# Patient Record
Sex: Female | Born: 1965
Health system: Southern US, Community
[De-identification: ages and names within clinical notes are randomized; demographics above are authoritative.]

## PROBLEM LIST (undated history)

## (undated) DIAGNOSIS — E88819 Insulin resistance, unspecified: Secondary | ICD-10-CM

## (undated) DIAGNOSIS — E8881 Metabolic syndrome: Secondary | ICD-10-CM

## (undated) DIAGNOSIS — E282 Polycystic ovarian syndrome: Secondary | ICD-10-CM

## (undated) DIAGNOSIS — M722 Plantar fascial fibromatosis: Secondary | ICD-10-CM

## (undated) DIAGNOSIS — E669 Obesity, unspecified: Secondary | ICD-10-CM

## (undated) DIAGNOSIS — F329 Major depressive disorder, single episode, unspecified: Secondary | ICD-10-CM

## (undated) DIAGNOSIS — F32A Depression, unspecified: Secondary | ICD-10-CM

## (undated) DIAGNOSIS — F419 Anxiety disorder, unspecified: Secondary | ICD-10-CM

## (undated) DIAGNOSIS — E785 Hyperlipidemia, unspecified: Secondary | ICD-10-CM

## (undated) DIAGNOSIS — I10 Essential (primary) hypertension: Secondary | ICD-10-CM

## (undated) HISTORY — PX: CHOLECYSTECTOMY: SHX55

## (undated) HISTORY — DX: Depression, unspecified: F32.A

## (undated) HISTORY — PX: OTHER SURGICAL HISTORY: SHX169

## (undated) HISTORY — DX: Anxiety disorder, unspecified: F41.9

## (undated) HISTORY — DX: Plantar fascial fibromatosis: M72.2

## (undated) HISTORY — DX: Insulin resistance, unspecified: E88.819

## (undated) HISTORY — PX: POLYPECTOMY: SHX149

## (undated) HISTORY — PX: DILATION AND CURETTAGE OF UTERUS: SHX78

## (undated) HISTORY — PX: HYSTEROSCOPY: SHX211

## (undated) HISTORY — DX: Polycystic ovarian syndrome: E28.2

## (undated) HISTORY — DX: Hyperlipidemia, unspecified: E78.5

## (undated) HISTORY — DX: Obesity, unspecified: E66.9

## (undated) HISTORY — DX: Major depressive disorder, single episode, unspecified: F32.9

## (undated) HISTORY — DX: Essential (primary) hypertension: I10

## (undated) HISTORY — DX: Metabolic syndrome: E88.81

---

## 2001-12-05 ENCOUNTER — Encounter: Payer: Self-pay | Admitting: Family Medicine

## 2001-12-05 LAB — CONVERTED CEMR LAB: Pap Smear: NEGATIVE

## 2002-04-27 ENCOUNTER — Encounter (INDEPENDENT_AMBULATORY_CARE_PROVIDER_SITE_OTHER): Payer: Self-pay

## 2002-04-27 ENCOUNTER — Ambulatory Visit (HOSPITAL_COMMUNITY): Admission: RE | Admit: 2002-04-27 | Discharge: 2002-04-27 | Payer: Self-pay | Admitting: *Deleted

## 2003-04-28 ENCOUNTER — Other Ambulatory Visit: Payer: Self-pay

## 2003-12-11 ENCOUNTER — Other Ambulatory Visit: Admission: RE | Admit: 2003-12-11 | Discharge: 2003-12-11 | Payer: Self-pay | Admitting: Gynecology

## 2004-02-21 ENCOUNTER — Encounter (INDEPENDENT_AMBULATORY_CARE_PROVIDER_SITE_OTHER): Payer: Self-pay | Admitting: Specialist

## 2004-02-21 ENCOUNTER — Observation Stay (HOSPITAL_COMMUNITY): Admission: RE | Admit: 2004-02-21 | Discharge: 2004-02-22 | Payer: Self-pay | Admitting: General Surgery

## 2004-03-17 ENCOUNTER — Ambulatory Visit (HOSPITAL_COMMUNITY): Admission: RE | Admit: 2004-03-17 | Discharge: 2004-03-17 | Payer: Self-pay | Admitting: Gynecology

## 2004-04-29 ENCOUNTER — Ambulatory Visit (HOSPITAL_COMMUNITY): Admission: RE | Admit: 2004-04-29 | Discharge: 2004-04-29 | Payer: Self-pay | Admitting: Gynecology

## 2004-05-25 ENCOUNTER — Ambulatory Visit: Payer: Self-pay | Admitting: Family Medicine

## 2004-06-09 ENCOUNTER — Ambulatory Visit: Payer: Self-pay | Admitting: Family Medicine

## 2004-07-08 DIAGNOSIS — G43009 Migraine without aura, not intractable, without status migrainosus: Secondary | ICD-10-CM | POA: Insufficient documentation

## 2004-07-20 ENCOUNTER — Ambulatory Visit: Payer: Self-pay | Admitting: Family Medicine

## 2004-12-28 ENCOUNTER — Ambulatory Visit: Payer: Self-pay | Admitting: Family Medicine

## 2005-02-04 ENCOUNTER — Ambulatory Visit (HOSPITAL_COMMUNITY): Admission: RE | Admit: 2005-02-04 | Discharge: 2005-02-04 | Payer: Self-pay | Admitting: Obstetrics and Gynecology

## 2005-03-04 ENCOUNTER — Ambulatory Visit: Payer: Self-pay | Admitting: Family Medicine

## 2005-05-17 ENCOUNTER — Ambulatory Visit: Payer: Self-pay | Admitting: Family Medicine

## 2005-06-10 ENCOUNTER — Ambulatory Visit: Payer: Self-pay | Admitting: Family Medicine

## 2005-07-01 ENCOUNTER — Ambulatory Visit: Payer: Self-pay | Admitting: Family Medicine

## 2005-08-09 ENCOUNTER — Ambulatory Visit: Payer: Self-pay | Admitting: Family Medicine

## 2005-09-15 ENCOUNTER — Ambulatory Visit: Payer: Self-pay | Admitting: Family Medicine

## 2005-09-27 ENCOUNTER — Ambulatory Visit: Payer: Self-pay | Admitting: Family Medicine

## 2005-10-01 ENCOUNTER — Ambulatory Visit: Payer: Self-pay | Admitting: Family Medicine

## 2005-10-08 ENCOUNTER — Ambulatory Visit: Payer: Self-pay | Admitting: Family Medicine

## 2006-02-01 ENCOUNTER — Encounter: Admission: RE | Admit: 2006-02-01 | Discharge: 2006-02-02 | Payer: Self-pay | Admitting: *Deleted

## 2006-04-04 ENCOUNTER — Encounter: Admission: RE | Admit: 2006-04-04 | Discharge: 2006-07-03 | Payer: Self-pay | Admitting: *Deleted

## 2006-04-07 ENCOUNTER — Ambulatory Visit: Payer: Self-pay | Admitting: Family Medicine

## 2006-07-13 ENCOUNTER — Ambulatory Visit: Payer: Self-pay | Admitting: Family Medicine

## 2006-07-13 LAB — CONVERTED CEMR LAB
ALT: 16 U/L
AST: 15 U/L
Albumin: 3.1 g/dL — ABNORMAL LOW
BUN: 9 mg/dL
CO2: 28 meq/L
Calcium: 9.2 mg/dL
Chloride: 109 meq/L
Creatinine, Ser: 0.9 mg/dL
GFR calc Af Amer: 90 mL/min
GFR calc non Af Amer: 74 mL/min
Glucose, Bld: 91 mg/dL
Hgb A1c MFr Bld: 5.8 %
Phosphorus: 3.5 mg/dL
Potassium: 3.7 meq/L
Sodium: 141 meq/L

## 2006-08-23 ENCOUNTER — Encounter: Payer: Self-pay | Admitting: Family Medicine

## 2006-08-23 DIAGNOSIS — E785 Hyperlipidemia, unspecified: Secondary | ICD-10-CM

## 2006-08-23 DIAGNOSIS — J309 Allergic rhinitis, unspecified: Secondary | ICD-10-CM | POA: Insufficient documentation

## 2006-08-23 DIAGNOSIS — N920 Excessive and frequent menstruation with regular cycle: Secondary | ICD-10-CM

## 2006-08-23 DIAGNOSIS — I10 Essential (primary) hypertension: Secondary | ICD-10-CM

## 2006-08-23 DIAGNOSIS — L408 Other psoriasis: Secondary | ICD-10-CM

## 2006-08-23 DIAGNOSIS — E282 Polycystic ovarian syndrome: Secondary | ICD-10-CM | POA: Insufficient documentation

## 2006-09-15 ENCOUNTER — Ambulatory Visit: Payer: Self-pay | Admitting: Family Medicine

## 2006-12-07 ENCOUNTER — Ambulatory Visit: Payer: Self-pay | Admitting: Family Medicine

## 2007-01-17 ENCOUNTER — Ambulatory Visit: Payer: Self-pay | Admitting: Family Medicine

## 2007-01-18 LAB — CONVERTED CEMR LAB
ALT: 19 units/L (ref 0–35)
AST: 18 units/L (ref 0–37)
HDL: 45.8 mg/dL (ref >39.0)
Hgb A1c MFr Bld: 5.6 % (ref 4.6–6.0)
Triglycerides: 220 mg/dL (ref 0–149)

## 2007-01-19 ENCOUNTER — Encounter (INDEPENDENT_AMBULATORY_CARE_PROVIDER_SITE_OTHER): Payer: Self-pay | Admitting: *Deleted

## 2007-03-13 ENCOUNTER — Encounter: Admission: RE | Admit: 2007-03-13 | Discharge: 2007-03-13 | Payer: Self-pay | Admitting: Obstetrics and Gynecology

## 2007-04-14 ENCOUNTER — Ambulatory Visit: Payer: Self-pay | Admitting: Family Medicine

## 2007-06-29 ENCOUNTER — Telehealth (INDEPENDENT_AMBULATORY_CARE_PROVIDER_SITE_OTHER): Payer: Self-pay | Admitting: *Deleted

## 2007-07-04 ENCOUNTER — Telehealth: Payer: Self-pay | Admitting: Family Medicine

## 2007-07-04 ENCOUNTER — Encounter: Payer: Self-pay | Admitting: Family Medicine

## 2007-08-31 ENCOUNTER — Ambulatory Visit: Payer: Self-pay | Admitting: Family Medicine

## 2008-02-16 ENCOUNTER — Ambulatory Visit: Payer: Self-pay | Admitting: Family Medicine

## 2008-02-16 DIAGNOSIS — K219 Gastro-esophageal reflux disease without esophagitis: Secondary | ICD-10-CM | POA: Insufficient documentation

## 2008-03-12 ENCOUNTER — Ambulatory Visit: Payer: Self-pay | Admitting: Gastroenterology

## 2008-03-14 ENCOUNTER — Ambulatory Visit: Payer: Self-pay | Admitting: Cardiology

## 2008-03-18 ENCOUNTER — Encounter: Payer: Self-pay | Admitting: Gastroenterology

## 2008-04-08 ENCOUNTER — Ambulatory Visit: Payer: Self-pay | Admitting: Family Medicine

## 2008-08-21 ENCOUNTER — Ambulatory Visit: Payer: Self-pay | Admitting: Family Medicine

## 2008-09-30 ENCOUNTER — Ambulatory Visit: Payer: Self-pay | Admitting: Family Medicine

## 2008-10-03 ENCOUNTER — Encounter: Admission: RE | Admit: 2008-10-03 | Discharge: 2008-10-03 | Payer: Self-pay | Admitting: Family Medicine

## 2008-10-11 ENCOUNTER — Ambulatory Visit: Payer: Self-pay | Admitting: Family Medicine

## 2008-10-12 ENCOUNTER — Encounter: Payer: Self-pay | Admitting: Family Medicine

## 2009-01-10 ENCOUNTER — Telehealth: Payer: Self-pay | Admitting: Family Medicine

## 2009-06-03 ENCOUNTER — Encounter: Admission: RE | Admit: 2009-06-03 | Discharge: 2009-06-03 | Payer: Self-pay | Admitting: Family Medicine

## 2009-06-03 LAB — HM MAMMOGRAPHY: HM Mammogram: NEGATIVE

## 2009-06-10 ENCOUNTER — Encounter (INDEPENDENT_AMBULATORY_CARE_PROVIDER_SITE_OTHER): Payer: Self-pay | Admitting: *Deleted

## 2010-05-05 ENCOUNTER — Ambulatory Visit: Payer: Self-pay | Admitting: Family Medicine

## 2010-05-05 LAB — CONVERTED CEMR LAB: Rapid Strep: NEGATIVE

## 2010-06-03 ENCOUNTER — Ambulatory Visit: Payer: Self-pay | Admitting: Family Medicine

## 2010-06-03 DIAGNOSIS — R739 Hyperglycemia, unspecified: Secondary | ICD-10-CM

## 2010-06-03 DIAGNOSIS — M6740 Ganglion, unspecified site: Secondary | ICD-10-CM | POA: Insufficient documentation

## 2010-06-10 LAB — CONVERTED CEMR LAB
BUN: 13 mg/dL (ref 6–23)
CO2: 29 meq/L (ref 19–32)
Creatinine, Ser: 0.9 mg/dL (ref 0.4–1.2)
Direct LDL: 138.6 mg/dL
Eosinophils Absolute: 0.3 10*3/uL (ref 0.0–0.7)
GFR calc non Af Amer: 92.43 mL/min (ref >60.00)
Glucose, Bld: 85 mg/dL (ref 70–99)
Hemoglobin: 14 g/dL (ref 12.0–15.0)
Hgb A1c MFr Bld: 6.3 % (ref 4.6–6.5)
MCHC: 33.2 g/dL (ref 30.0–36.0)
Monocytes Relative: 2.7 % — ABNORMAL LOW (ref 3.0–12.0)
Neutro Abs: 9.6 10*3/uL — ABNORMAL HIGH (ref 1.4–7.7)
Neutrophils Relative %: 72.3 % (ref 43.0–77.0)
Phosphorus: 4.1 mg/dL (ref 2.3–4.6)
Platelets: 290 10*3/uL (ref 150.0–400.0)
RBC: 4.79 M/uL (ref 3.87–5.11)
RDW: 16 % — ABNORMAL HIGH (ref 11.5–14.6)
TSH: 0.93 microintl units/mL (ref 0.35–5.50)
Total Protein: 7 g/dL (ref 6.0–8.3)
VLDL: 29 mg/dL (ref 0.0–40.0)
WBC: 13.3 10*3/uL — ABNORMAL HIGH (ref 4.5–10.5)

## 2010-06-16 ENCOUNTER — Other Ambulatory Visit: Payer: Self-pay | Admitting: Family Medicine

## 2010-06-16 ENCOUNTER — Ambulatory Visit
Admission: RE | Admit: 2010-06-16 | Discharge: 2010-06-16 | Payer: Self-pay | Source: Home / Self Care | Attending: Family Medicine | Admitting: Family Medicine

## 2010-06-16 DIAGNOSIS — D72829 Elevated white blood cell count, unspecified: Secondary | ICD-10-CM | POA: Insufficient documentation

## 2010-06-16 LAB — CBC WITH DIFFERENTIAL/PLATELET
Basophils Absolute: 0 10*3/uL (ref 0.0–0.1)
Basophils Relative: 0.4 % (ref 0.0–3.0)
Eosinophils Absolute: 0.3 10*3/uL (ref 0.0–0.7)
Eosinophils Relative: 3.1 % (ref 0.0–5.0)
HCT: 41.3 % (ref 36.0–46.0)
Hemoglobin: 14.1 g/dL (ref 12.0–15.0)
Lymphocytes Relative: 24.8 % (ref 12.0–46.0)
Lymphs Abs: 2.3 10*3/uL (ref 0.7–4.0)
MCHC: 34.2 g/dL (ref 30.0–36.0)
MCV: 86.6 fl (ref 78.0–100.0)
Monocytes Absolute: 0.5 10*3/uL (ref 0.1–1.0)
Monocytes Relative: 5.8 % (ref 3.0–12.0)
Neutro Abs: 6 10*3/uL (ref 1.4–7.7)
Neutrophils Relative %: 65.9 % (ref 43.0–77.0)
Platelets: 303 10*3/uL (ref 150.0–400.0)
RBC: 4.77 Mil/uL (ref 3.87–5.11)
RDW: 15.9 % — ABNORMAL HIGH (ref 11.5–14.6)
WBC: 9.2 10*3/uL (ref 4.5–10.5)

## 2010-06-16 LAB — CONVERTED CEMR LAB
Bilirubin Urine: NEGATIVE
Casts: 0 /lpf
Ketones, urine, test strip: NEGATIVE
Nitrite: NEGATIVE
Protein, U semiquant: NEGATIVE
Specific Gravity, Urine: 1.02
Urobilinogen, UA: 0.2
WBC Urine, dipstick: NEGATIVE
pH: 6

## 2010-06-28 ENCOUNTER — Encounter: Payer: Self-pay | Admitting: Gastroenterology

## 2010-06-28 ENCOUNTER — Encounter: Payer: Self-pay | Admitting: Gynecology

## 2010-06-28 ENCOUNTER — Encounter: Payer: Self-pay | Admitting: Family Medicine

## 2010-07-07 NOTE — Assessment & Plan Note (Signed)
Summary: CONGESTION/STREP???DLO   Vital Signs:  Patient profile:   45 year old female Height:      67 inches Weight:      276 pounds BMI:     43.38 Temp:     97.9 degrees F oral Pulse rate:   88 / minute Pulse rhythm:   regular BP sitting:   186 / 112  (left arm) Cuff size:   large  Vitals Entered By: Delilah Shan CMA Duncan Dull) (May 05, 2010 2:40 PM)  Serial Vital Signs/Assessments:  Time      Position  BP       Pulse  Resp  Temp     By                     170/110                        Crawford Givens MD  CC: Congestion, ? Strep (son dx. yesterday)   History of Present Illness: Son with strep dx recently.  He is a DM1.   Pt started with symptoms about 1 week ago.  Started with post nasal gtt and throat tickle that triggered a cough.  Then patient had aches and congestion, increase in cough.  No known fevers.   No NAVD.  No rash.  Some ST.   Allergies: No Known Drug Allergies  Social History: Marital Status: married Children: 1 Occupation: Research officer, political party , former Biomedical engineer Patient has never smoked.  Alcohol Use - no Regular Exercise - no  Review of Systems       See HPI.  Otherwise negative.    Physical Exam  General:  GEN: nad, alert and oriented, obese HEENT: mucous membranes moist, TM w/o erythema, nasal epithelium injected, OP with cobblestoning, no exudates NECK: supple w/o LA CV: rrr. PULM: ctab, no inc wob ABD: soft, +bs EXT: no edema    Impression & Recommendations:  Problem # 1:  URI (ICD-465.9) Likely viral.  Supportive tx.  No indication for antibiotics.  follow up as needed.  RST neg.  Pt had held BP meds.  Pt to restart and then follow up for BP check here in the office if she can't check it outside of teh clinic.  She agrees.  The following medications were removed from the medication list:    Mobic 15 Mg Tabs (Meloxicam) .Marland Kitchen... 1 by mouth once daily with food as needed  Complete Medication List: 1)  Procardia Xl 90 Mg Tb24  (Nifedipine) .... Take one by mouth daily 2)  Metformin Hcl 750 Mg Tb24 (Metformin hcl) .... Take one by mouth two times a day 3)  Hydrochlorothiazide 25 Mg Tabs (Hydrochlorothiazide) .Marland Kitchen.. 1 by mouth once daily  Patient Instructions: 1)  I would use plain claritin/loratadine 10mg  a day for the runny nose and congestion along with nasal saline.  Try to get some rest, drink lots of clear liquids, and use Tylenol for fever and comfort. Take care.    Orders Added: 1)  Est. Patient Level III [09811]    Current Allergies (reviewed today): No known allergies   Laboratory Results  Date/Time Received: May 05, 2010 2:53 PM   Other Tests  Rapid Strep: negative

## 2010-07-07 NOTE — Letter (Signed)
Summary: Results Follow up Letter  [place] [facility]  [address] [place], [place] [address]   Phone: [phone]  Fax: [phone]    06/10/2009 MRN: [id number]    [facility] [employer] [address] [place], [place]  [address]    Dear Ms. Nater,  The following are the results of your recent test(s):  Test         Result    Pap Smear:        Normal _____  Not Normal _____ Comments: ______________________________________________________ Cholesterol: LDL(Bad cholesterol):         Your goal is less than:         HDL (Good cholesterol):       Your goal is more than: Comments:  ______________________________________________________ Mammogram:        Normal __X___  Not Normal _____ Comments:  Yearly follow up is recommended.   ___________________________________________________________________ Hemoccult:        Normal _____  Not normal _______ Comments:    _____________________________________________________________________ Other Tests:    We routinely do not discuss normal results over the telephone.  If you desire a copy of the results, or you have any questions about this information we can discuss them at your next office visit.   Sincerely,    Marne A. Milinda Antis, M.D.  MAT:lsf

## 2010-07-09 NOTE — Assessment & Plan Note (Signed)
Summary: NEED MED REFILL AND BP CK/RI   Vital Signs:  Patient profile:   45 year old female Height:      67 inches Weight:      280.25 pounds BMI:     44.05 Temp:     98.5 degrees F oral Pulse rate:   88 / minute Pulse rhythm:   regular BP sitting:   144 / 88  (left arm) Cuff size:   large  Vitals Entered By: Lewanda Rife LPN (June 03, 2010 3:07 PM)  Serial Vital Signs/Assessments:  Time      Position  BP       Pulse  Resp  Temp     By                     161/09                         Judith Part MD  CC: med refill and BP check   History of Present Illness: here for HTN visit and med refil  has been feeling ok in general  busy holidays - goes by fast   on procardia and hct for HTN  bp first check today was 144/88 has not checked her blood pressure recently  there have been some missed doses- did take it today     wt is up 4 lb  no labs in a while hx of LDL in 130s in past   is still on metformin - no problems with that  no low sugars  other than the holidays - does not go overboard with the sugars  she does keep gaining weight   is exercising -- joined the gym -- since the summer  likes to get on the elliptical and the treadmill -- building up her cardio (at 30 minutes now)  some sit ups  that gives her some more energy  goal is 5 days - goes 3 days   has ganglion cyst on L wrist - not bothering her or getting larger    Allergies (verified): No Known Drug Allergies  Past History:  Past Medical History: Anxiety Depression Hyperlipidemia Hypertension obesitly plantar fasciitis  pcos with hirsut (hair growth on face) insulin resistance   Physical Exam  General:  overweight but generally well appearing  Head:  normocephalic, atraumatic, and no abnormalities observed.   Eyes:  vision grossly intact, pupils equal, pupils round, and pupils reactive to light.  no conjunctival pallor, injection or icterus  Mouth:  pharynx pink and moist.     Neck:  No deformities, masses, or tenderness noted. Chest Wall:  No deformities, masses, or tenderness noted. Lungs:  Normal respiratory effort, chest expands symmetrically. Lungs are clear to auscultation, no crackles or wheezes. Heart:  Normal rate and regular rhythm. S1 and S2 normal without gallop, murmur, click, rub or other extra sounds. Abdomen:  no renal bruits  Msk:  stable gangion cyst L dorsal wrist- is 1 cm  nontender/ firm nl rom wrist and hand  Pulses:  R and L carotid,radial,femoral,dorsalis pedis and posterior tibial pulses are full and equal bilaterally Extremities:  No clubbing, cyanosis, edema, or deformity noted with normal full range of motion of all joints.   Neurologic:  sensation intact to light touch, gait normal, and DTRs symmetrical and normal.   Skin:  dark hair growth on chin no acne or rashes  Cervical Nodes:  No lymphadenopathy noted Psych:  normal affect,  talkative and pleasant    Impression & Recommendations:  Problem # 1:  HYPERTENSION (ICD-401.9) Assessment Improved  this is improved with current meds- much better on 2nd check  disc exercise -doing well with that  making a plan for wt loss and better diet labs today Her updated medication list for this problem includes:    Procardia Xl 90 Mg Tb24 (Nifedipine) .Marland Kitchen... Take one by mouth daily    Hydrochlorothiazide 25 Mg Tabs (Hydrochlorothiazide) .Marland Kitchen... 1 by mouth once daily  BP today: 144/88-- re check 122/82  Prior BP: 186/112 (05/05/2010)  Labs Reviewed: K+: 3.7 (07/13/2006) Creat: : 0.9 (03/12/2008)   Chol: 195 (01/17/2007)   HDL: 45.8 (01/17/2007)   LDL: DEL (01/17/2007)   TG: 220 (01/17/2007)  Orders: Venipuncture (16109) TLB-Lipid Panel (80061-LIPID) TLB-Renal Function Panel (80069-RENAL) TLB-CBC Platelet - w/Differential (85025-CBCD) TLB-Hepatic/Liver Function Pnl (80076-HEPATIC) TLB-TSH (Thyroid Stimulating Hormone) (84443-TSH) TLB-A1C / Hgb A1C (Glycohemoglobin)  (83036-A1C) Prescription Created Electronically [id number])  BP today: 144/88 Prior BP: 186/112 (05/05/2010)  Labs Reviewed: K+: 3.7 (07/13/2006) Creat: : 0.9 (03/12/2008)   Chol: 195 (01/17/2007)   HDL: 45.8 (01/17/2007)   LDL: DEL (01/17/2007)   TG: 220 (01/17/2007)  Problem # 2:  INSULIN RESISTANCE SYNDROME (ICD-259.8) Assessment: Unchanged with pcos on metformin lab today  rev low glycemic diet and need for wt loss  Orders: TLB-A1C / Hgb A1C (Glycohemoglobin) (83036-A1C) Prescription Created Electronically [phone])  Problem # 3:  HYPERLIPIDEMIA (ICD-272.4) Assessment: Unchanged  lab today rev low sat fat diet  may be up from the holidays Orders: Venipuncture (91478) TLB-Lipid Panel (80061-LIPID) TLB-Renal Function Panel (80069-RENAL) TLB-CBC Platelet - w/Differential (85025-CBCD) TLB-Hepatic/Liver Function Pnl (80076-HEPATIC) TLB-TSH (Thyroid Stimulating Hormone) (84443-TSH) TLB-A1C / Hgb A1C (Glycohemoglobin) (83036-A1C) Prescription Created Electronically [phone])  Labs Reviewed: SGOT: 18 (01/17/2007)   SGPT: 19 (01/17/2007)   HDL:45.8 (01/17/2007)  LDL:DEL (01/17/2007)  Chol:195 (01/17/2007)  Trig:220 (01/17/2007)  Problem # 4:  POLYCYSTIC OVARIAN DISEASE (ICD-256.4) Assessment: Unchanged with hirsuit features incl hair growth on chin disc diff tx opt for this  will try vaniqa cream and update Orders: Venipuncture (13086) TLB-Lipid Panel (80061-LIPID) TLB-Renal Function Panel (80069-RENAL) TLB-CBC Platelet - w/Differential (85025-CBCD) TLB-Hepatic/Liver Function Pnl (80076-HEPATIC) TLB-TSH (Thyroid Stimulating Hormone) (84443-TSH) TLB-A1C / Hgb A1C (Glycohemoglobin) (83036-A1C) Prescription Created Electronically [phone])  Problem # 5:  GANGLION CYST, WRIST, LEFT (ICD-727.41) Assessment: New no changes and not symptomatic at this time will observe consider referral if increase in size or any pain or limited rom Orders: Prescription Created Electronically  [phone])  Complete Medication List: 1)  Procardia Xl 90 Mg Tb24 (Nifedipine) .... Take one by mouth daily 2)  Metformin Hcl 750 Mg Tb24 (Metformin hcl) .... Take one by mouth two times a day 3)  Hydrochlorothiazide 25 Mg Tabs (Hydrochlorothiazide) .Marland Kitchen.. 1 by mouth once daily 4)  Vaniqa 13.9 % Crea (Eflornithine hcl) .... Apply to affected area two times a day at least 8 hours apart  Patient Instructions: 1)  keep working on healthy diet and exercise and weight loss  2)  no change in medicines 3)  labs today Prescriptions: VANIQA 13.9 % CREA (EFLORNITHINE HCL) apply to affected area two times a day at least 8 hours apart  #1 medium x 3   Entered and Authorized by:   Judith Part MD   Signed by:   Judith Part MD on 06/03/2010   Method used:   Electronically to        [facility]* (retail)       [address]       [place], [place]  [address]       Ph: [phone]       Fax: [phone]   RxID:   [id number] HYDROCHLOROTHIAZIDE 25 MG TABS (HYDROCHLOROTHIAZIDE) 1 by mouth once daily  #90 x 3   Entered and Authorized by:   Judith Part MD   Signed by:   Judith Part MD on 06/03/2010   Method used:   Print then Give to Patient   RxID:   [id number] METFORMIN HCL 750 MG  TB24 (METFORMIN HCL) Take one by mouth two times a day  #180 x 3   Entered and Authorized by:   Judith Part MD   Signed by:   Judith Part MD on 06/03/2010   Method used:   Print then Give to Patient   RxID:   [id number] PROCARDIA XL 90 MG  TB24 (NIFEDIPINE) Take one by mouth daily  #90 x 3   Entered and Authorized by:   Judith Part MD   Signed by:   Judith Part MD on 06/03/2010   Method used:   Print then Give to Patient   RxID:   [id number] HYDROCHLOROTHIAZIDE 25 MG TABS (HYDROCHLOROTHIAZIDE) 1 by mouth once daily  #30 x 0   Entered and Authorized by:   Judith Part MD   Signed by:   Judith Part MD on 06/03/2010   Method used:   Electronically to         [facility]* (retail)       [address]       [place], [place]  [address]       Ph: [phone]       Fax: [phone]   RxID:   [id number] PROCARDIA XL 90 MG  TB24 (NIFEDIPINE) Take one by mouth daily  #30 x 0   Entered and Authorized by:   Judith Part MD   Signed by:   Judith Part MD on 06/03/2010   Method used:   Electronically to        [facility]* (retail)       [address]       [place], [place]  [address]       Ph: [phone]       Fax: [phone]   RxID:   [id number]    Orders Added: 1)  Venipuncture [74081] 2)  TLB-Lipid Panel [80061-LIPID] 3)  TLB-Renal Function Panel [80069-RENAL] 4)  TLB-CBC Platelet - w/Differential [85025-CBCD] 5)  TLB-Hepatic/Liver Function Pnl [80076-HEPATIC] 6)  TLB-TSH (Thyroid Stimulating Hormone) [84443-TSH] 7)  TLB-A1C / Hgb A1C (Glycohemoglobin) [83036-A1C] 8)  Prescription Created Electronically [G8553] 9)  Est. Patient Level IV [44818]    Current Allergies (reviewed today): No known allergies
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
## 2010-07-29 ENCOUNTER — Encounter: Payer: Self-pay | Admitting: Family Medicine

## 2010-08-08 ENCOUNTER — Encounter: Payer: Self-pay | Admitting: Family Medicine

## 2010-09-09 ENCOUNTER — Other Ambulatory Visit: Payer: Self-pay | Admitting: Family Medicine

## 2010-09-09 DIAGNOSIS — E7800 Pure hypercholesterolemia, unspecified: Secondary | ICD-10-CM

## 2010-09-10 ENCOUNTER — Other Ambulatory Visit: Payer: Self-pay

## 2010-09-15 ENCOUNTER — Ambulatory Visit: Payer: Self-pay | Admitting: Family Medicine

## 2010-09-21 ENCOUNTER — Ambulatory Visit: Payer: Self-pay | Admitting: Family Medicine

## 2010-09-30 ENCOUNTER — Ambulatory Visit (INDEPENDENT_AMBULATORY_CARE_PROVIDER_SITE_OTHER): Payer: BC Managed Care – PPO | Admitting: Family Medicine

## 2010-09-30 ENCOUNTER — Encounter: Payer: Self-pay | Admitting: Family Medicine

## 2010-09-30 DIAGNOSIS — I10 Essential (primary) hypertension: Secondary | ICD-10-CM

## 2010-09-30 DIAGNOSIS — L989 Disorder of the skin and subcutaneous tissue, unspecified: Secondary | ICD-10-CM

## 2010-09-30 DIAGNOSIS — E785 Hyperlipidemia, unspecified: Secondary | ICD-10-CM

## 2010-09-30 DIAGNOSIS — E348 Other specified endocrine disorders: Secondary | ICD-10-CM

## 2010-09-30 NOTE — Assessment & Plan Note (Signed)
bp is up due to stress and forgetting med this am  Adv to get home cuff  Disc compliance with med  F/u 1-2 wk nurse visit bp check If ok will f/u in 6 months
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
## 2010-09-30 NOTE — Assessment & Plan Note (Signed)
2-3 cm area of hyperpigmentation on L back  Needs eval - with dermatitis symptoms intermittently for years  Is relatively calm today

## 2010-09-30 NOTE — Assessment & Plan Note (Signed)
Will check with next visit Rev last labs Watching sat fats in diet

## 2010-09-30 NOTE — Assessment & Plan Note (Signed)
Last a1c was elevated so re checking today Disc low glycemic diet Continues to work on wt loss which is most important

## 2010-09-30 NOTE — Patient Instructions (Signed)
Schedule nurse visit in 1-2 weeks for bp check If you get a cuff for home - I recommend OMRON for arm (not wrist) - size large  Always check blood pressure when relaxed  Try to get some exercise Keep working on weight loss  We will refer you to dermatology  when you check out  Labs today  Follow up with me in about 6 months

## 2010-10-01 ENCOUNTER — Other Ambulatory Visit (INDEPENDENT_AMBULATORY_CARE_PROVIDER_SITE_OTHER): Payer: BC Managed Care – PPO

## 2010-10-01 DIAGNOSIS — E7800 Pure hypercholesterolemia, unspecified: Secondary | ICD-10-CM

## 2010-10-01 LAB — RENAL FUNCTION PANEL
Albumin: 3.4 g/dL — ABNORMAL LOW (ref 3.5–5.2)
CO2: 31 mEq/L (ref 19–32)
Chloride: 104 mEq/L (ref 96–112)
Glucose, Bld: 99 mg/dL (ref 70–99)
Sodium: 141 mEq/L (ref 135–145)

## 2010-10-01 LAB — CBC WITH DIFFERENTIAL/PLATELET
Basophils Relative: 0.4 % (ref 0.0–3.0)
Eosinophils Absolute: 0.3 10*3/uL (ref 0.0–0.7)
HCT: 41.7 % (ref 36.0–46.0)
Hemoglobin: 13.9 g/dL (ref 12.0–15.0)
Monocytes Relative: 7.6 % (ref 3.0–12.0)
Neutro Abs: 5.2 10*3/uL (ref 1.4–7.7)
Neutrophils Relative %: 59.7 % (ref 43.0–77.0)
Platelets: 260 10*3/uL (ref 150.0–400.0)
RBC: 4.76 Mil/uL (ref 3.87–5.11)
RDW: 15.4 % — ABNORMAL HIGH (ref 11.5–14.6)

## 2010-10-01 LAB — HEMOGLOBIN A1C: Hgb A1c MFr Bld: 6.3 % (ref 4.6–6.5)

## 2010-10-23 NOTE — Op Note (Signed)
NAMEMARION, Hannah Webb                             ACCOUNT NO.:  [id number]   MEDICAL RECORD NO.:  [id number]                   PATIENT TYPE:  OBV   LOCATION:  0440                                 FACILITY:  [facility]   PHYSICIAN:  Timothy E. Earlene Plater, M.D.              DATE OF BIRTH:  [birth year]   DATE OF PROCEDURE:  02/21/2004  DATE OF DISCHARGE:                                 OPERATIVE REPORT   PREOPERATIVE DIAGNOSIS:  Cholecystolithiasis, chronic.   POSTOPERATIVE DIAGNOSIS:  Cholecystolithiasis, chronic.   PROCEDURE:  Laparoscopy with photography and laparoscopic cholecystectomy  and operative cholangiogram.   SURGEON:  Timothy E. Earlene Plater, M.D.   ASSISTANT:  Lebron Conners, M.D.   ANESTHESIA:  General.   Hannah Webb is 69.  Has had apparent episodes of cholecystitis for a year or  more.  Was seen in the [facility] emergency room with elevated white  count, normal liver functions, and gallstones on ultrasound.  That episode  subsided, and she is now ready for an elective cholecystectomy.  She also  has a history of ovarian cysts and is followed by Dr. Teodora Medici.  The  patient was seen, interviewed, and the permit signed today.   She was taken to the operating room and placed supine.  General endotracheal  anesthesia administered.  Preoperative antibiotics, PAS hose, and oral  gastric tube placed.  The abdomen was prepped and draped in the usual  fashion.  Marcaine 0.5% with epinephrine was used throughout for each  incision.  An infraumbilical incision was made.  The fascia was identified  and opened vertically.  The peritoneum entered without complications.  Hasson catheter placed and tied in place with a #1 Vicryl.  Under direct  vision, a 10 mm epigastric port and two 5 mm ports in the right upper  quadrant.  We placed the patient in a head-down position.  The pelvis was  visualized.  The uterus was grasped and reflected anteriorly.  A large,  apparently benign cyst on the  left ovary was seen and photographed x2.  The  right ovary perhaps enlarged but otherwise normal on the right.  It was  identified and photographed x1.   Attention was then turned to the gallbladder, which grossly appeared normal  but with omental adhesions.  It was grasped and placed under tension.  Adhesions taken down.  The base of the gallbladder dissected.  A cystic duct  entering the gallbladder anteriorly was seen, identified, and dissected free  with a complete posterior window placed on the gallbladder side of the  cystic duct and the cystic duct opened.  A catheter placed percutaneously  was placed into the cystic duct remnant and a clip placed.  Real-time  fluoroscopy and Hypaque injection revealed complete filling of the biliary  tree with prompt emptying into the duodenum.  A clip and catheter were  removed.  The cystic duct stump  was triply clipped and divided.  Two  branches of the artery were identified, dissected, and triply clipped.  The  gallbladder was then removed from the gallbladder bed and because of its  thin nature and intrahepatic location, was lacerated a couple of times.  Bile was quickly suctioned away.  Gallbladder was removed from the  gallbladder bed with cautery and placed in an EndoCatch bag, and then  copious irrigation carried out until clear.  No stones were spilled.  The  gallbladder removed through the infraumbilical incision and that incision  closed under direct vision.  Further irrigation carried out once completely  clear.  All counts were correct.  All instruments, trocars, CO2  gas removed.  The skin incision was checked and closed with 4-0 Monocryl.  Final counts correct.  Steri-Strips and dry sterile dressing applied.  She  tolerated it well and was awakened and taken to the recovery room in good  condition.                                               Timothy E. Earlene Plater, M.D.    TED/MEDQ  D:  02/21/2004  T:  02/21/2004  Job:  [id number]    cc:   Marne A. Milinda Antis, M.D. [facility]   Howard C. Mezer, M.D.  [address]  [place]  [place] [address]  Fax: [phone]

## 2010-10-23 NOTE — Op Note (Signed)
Hannah Webb, Hannah Webb                             ACCOUNT NO.:  [id number]   MEDICAL RECORD NO.:  [id number]                   PATIENT TYPE:  AMB   LOCATION:  SDC                                  FACILITY:  [facility]   PHYSICIAN:  Georgina Peer, M.D.              DATE OF BIRTH:  1966/01/14   DATE OF PROCEDURE:  04/27/2002  DATE OF DISCHARGE:                                 OPERATIVE REPORT   PREOPERATIVE DIAGNOSES:  1. Menorrhagia.  2. Irregular bleeding.   POSTOPERATIVE DIAGNOSES:  1. Menorrhagia.  2. Irregular bleeding.  3. Polypoid endometrium.   OPERATION PERFORMED:  1. Hysteroscopy with resection of endometrium.  2. Dilatation and curettage.   SURGEON:  Georgina Peer, M.D.   ANESTHESIA:  LMA plus paracervical 1% Xylocaine block.   ESTIMATED BLOOD LOSS:  Less than 50 cc.   FLUID DEFICIT:  200 cc.   COMPLICATIONS:  None.   FINDINGS:  Polypoid endometrium on posterior wall and thickened endometrium  in the rest of the cavity.   INDICATIONS:  A 45 year old gravida 1, para 1 with menorrhagia and irregular  bleeding.  A saline infusion sonogram revealed a thickened posterior wall  endometrium with possibly a polyp or submucous myoma.  She was brought in  for evaluation and resection with D&C.   DESCRIPTION OF PROCEDURE:  The patient was taken to the operating room.  Received LMA anesthesia.  Was prepped and draped in normal sterile fashion.  The uterus was normal size.  Speculum viewed the cervix which was then  grasped with a tenaculum.  20 cc of 1% Xylocaine paracervical block was then  injected.  The cervix was then sounded to 10.5 cm and progressively dilated  to 29 [place] with Shawnie Pons dilators.  The resectoscope was used to visualize  the endometrial cavity.  The endocervix and lower uterine segment appeared  normal.  There was polypoid endometrium on the posterior walls, thickened  endometrium on the lateral and anterior walls.  There was no evidence of  submucous  myomas or discreet polyps.  There was no abnormal vasculature.  Both tubal ostia were seen.  The posterior endometrium was resected and sent  as a separate specimen and the endometrial cavity was then  sharply curetted until no further tissue returned.  Photo documentation of  these findings was made.  The patient tolerated the procedure well and  sponge, needle, and instrument counts were correct.  The patient returned to  recovery area in stable condition.                                               Georgina Peer, M.D.    JPN/MEDQ  D:  04/27/2002  T:  04/27/2002  Job:  [id number]   cc:  Marne A. Milinda Antis, M.D. [facility]

## 2010-11-09 ENCOUNTER — Other Ambulatory Visit: Payer: Self-pay | Admitting: Surgery

## 2011-03-22 ENCOUNTER — Ambulatory Visit: Payer: BC Managed Care – PPO | Admitting: Family Medicine

## 2011-03-22 DIAGNOSIS — Z0289 Encounter for other administrative examinations: Secondary | ICD-10-CM

## 2011-07-13 ENCOUNTER — Other Ambulatory Visit: Payer: Self-pay | Admitting: Family Medicine

## 2011-07-14 NOTE — Telephone Encounter (Signed)
[facility] request refill on Nifedipine, 90 mg, Metformin 750 mg 24 hr tab and HCTZ 25 mg. Pt last seen 09/30/10 but was a no show on 03/22/11.Please advise.

## 2011-07-15 NOTE — Telephone Encounter (Signed)
Will refil for 90 days Please have her make appt

## 2011-07-15 NOTE — Telephone Encounter (Signed)
Spoke with pt and scheduled a f/u in March.

## 2011-08-18 ENCOUNTER — Ambulatory Visit: Payer: BC Managed Care – PPO | Admitting: Family Medicine

## 2011-09-08 ENCOUNTER — Encounter: Payer: Self-pay | Admitting: Family Medicine

## 2011-09-08 ENCOUNTER — Ambulatory Visit (INDEPENDENT_AMBULATORY_CARE_PROVIDER_SITE_OTHER): Payer: BC Managed Care – PPO | Admitting: Family Medicine

## 2011-09-08 VITALS — BP 132/100 | HR 76 | Temp 98.0°F | Ht 67.0 in | Wt 276.5 lb

## 2011-09-08 DIAGNOSIS — E348 Other specified endocrine disorders: Secondary | ICD-10-CM

## 2011-09-08 DIAGNOSIS — E785 Hyperlipidemia, unspecified: Secondary | ICD-10-CM

## 2011-09-08 DIAGNOSIS — I10 Essential (primary) hypertension: Secondary | ICD-10-CM

## 2011-09-08 LAB — CBC WITH DIFFERENTIAL/PLATELET
Basophils Relative: 0.6 % (ref 0.0–3.0)
HCT: 40.8 % (ref 36.0–46.0)
Lymphs Abs: 2.6 10*3/uL (ref 0.7–4.0)
MCHC: 32.8 g/dL (ref 30.0–36.0)
Monocytes Relative: 7.8 % (ref 3.0–12.0)
Neutrophils Relative %: 57.1 % (ref 43.0–77.0)
RDW: 17.3 % — ABNORMAL HIGH (ref 11.5–14.6)
WBC: 8.6 10*3/uL (ref 4.5–10.5)

## 2011-09-08 LAB — COMPREHENSIVE METABOLIC PANEL
ALT: 27 U/L (ref 0–35)
AST: 20 U/L (ref 0–37)
Albumin: 3.5 g/dL (ref 3.5–5.2)
Alkaline Phosphatase: 99 U/L (ref 39–117)
BUN: 8 mg/dL (ref 6–23)
Calcium: 9 mg/dL (ref 8.4–10.5)
GFR: 101.36 mL/min (ref >60.00)
Glucose, Bld: 96 mg/dL (ref 70–99)
Sodium: 142 mEq/L (ref 135–145)
Total Protein: 6.9 g/dL (ref 6.0–8.3)

## 2011-09-08 LAB — TSH: TSH: 1.34 u[IU]/mL (ref 0.35–5.50)

## 2011-09-08 NOTE — Assessment & Plan Note (Signed)
Check labs today Diet suboptimal Disc low sat fat diet and reasons to get chol down Disc at f/u
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
## 2011-09-08 NOTE — Assessment & Plan Note (Signed)
a1c today No change in med  Disc need for wt loss  Disc at f/u  Rev low glycemic diet

## 2011-09-08 NOTE — Assessment & Plan Note (Signed)
bp up after forgetting med for 4 d Will get back on track F/u about a mo for re check Lab today

## 2011-09-08 NOTE — Patient Instructions (Signed)
Get creative in terms of planning exercise and healthy habits  Labs today  Follow up in about a month

## 2011-09-09 ENCOUNTER — Encounter: Payer: Self-pay | Admitting: *Deleted

## 2011-10-08 ENCOUNTER — Ambulatory Visit: Payer: BC Managed Care – PPO | Admitting: Family Medicine

## 2011-10-11 ENCOUNTER — Other Ambulatory Visit: Payer: Self-pay | Admitting: Family Medicine

## 2011-11-29 ENCOUNTER — Other Ambulatory Visit: Payer: Self-pay | Admitting: Family Medicine

## 2011-11-29 DIAGNOSIS — Z1231 Encounter for screening mammogram for malignant neoplasm of breast: Secondary | ICD-10-CM

## 2011-12-20 ENCOUNTER — Ambulatory Visit
Admission: RE | Admit: 2011-12-20 | Discharge: 2011-12-20 | Disposition: A | Discharge status: Home / Self Care | Payer: BC Managed Care – PPO | Source: Ambulatory Visit | Attending: Family Medicine | Admitting: Family Medicine

## 2011-12-20 DIAGNOSIS — Z1231 Encounter for screening mammogram for malignant neoplasm of breast: Secondary | ICD-10-CM

## 2011-12-21 ENCOUNTER — Encounter: Payer: BC Managed Care – PPO | Admitting: Obstetrics & Gynecology

## 2012-01-05 ENCOUNTER — Ambulatory Visit (INDEPENDENT_AMBULATORY_CARE_PROVIDER_SITE_OTHER): Payer: BC Managed Care – PPO | Admitting: Family Medicine

## 2012-01-05 ENCOUNTER — Encounter: Payer: Self-pay | Admitting: Family Medicine

## 2012-01-05 VITALS — BP 132/84 | HR 64 | Temp 97.9°F | Wt 273.5 lb

## 2012-01-05 DIAGNOSIS — R21 Rash and other nonspecific skin eruption: Secondary | ICD-10-CM

## 2012-01-05 LAB — POCT SKIN KOH: Skin KOH, POC: NEGATIVE

## 2012-01-05 MED ORDER — FLUCONAZOLE 150 MG PO TABS: 150.0000 mg | ORAL_TABLET | ORAL | Status: AC

## 2012-01-05 MED ORDER — CLOTRIMAZOLE 1 % EX CREA: TOPICAL_CREAM | Freq: Two times a day (BID) | CUTANEOUS | Status: DC

## 2012-01-05 NOTE — Assessment & Plan Note (Signed)
Consistent with tinea corporis although KOH negative. Treat as such. See pt instructions. Update if sxs worsening despite treatment.

## 2012-01-05 NOTE — Progress Notes (Signed)
  Subjective:    Patient ID: Hannah Webb, female    DOB: 1965/07/10, 46 y.o.   MRN: [id number]  HPI CC: skin rash  Place on right arm present for last 6 weeks, treated with OTC tinactin for approx 10 days.  So started cortisone cream.  Has doubled in size, now getting other lesions on chest, one on back, some on stomach.  None on legs.  Itchy rash.  Not tender.  Mildly scaly.  Had similar rash in high school.  No rash contacts at home.  No pets at home.    No fevers,chills, joint pain,s oral lesions, abd pain, nausea.  Review of Systems Per HPI    Objective:   Physical Exam  Nursing note and vitals reviewed. Constitutional: She appears well-developed and well-nourished. No distress.  Skin: Skin is warm and dry. Rash noted.          Several slightly scaly erythematous raised annular rash with central clearing Largest R upper arm. Pruritic.       Assessment & Plan:

## 2012-01-05 NOTE — Patient Instructions (Signed)
Looks like ringworm. Stop hydrocortisone cream. Start lotrimin cream. Twice daily for 4 weeks. Take diflucan weekly for 2 weeks. Let us know if not improving with this treatment.  Ringworm, Body [Tinea Corporis] Ringworm is a fungal infection of the skin and hair. Another name for this problem is Tinea Corporis. It has nothing to do with worms. A fungus is an organism that lives on dead cells (the outer layer of skin). It can involve the entire body. It can spread from infected pets. Tinea corporis can be a problem in wrestlers who may get the infection form other players/opponents, equipment and mats. DIAGNOSIS  A skin scraping can be obtained from the affected area and by looking for fungus under the microscope. This is called a KOH examination.  HOME CARE INSTRUCTIONS   Ringworm may be treated with a topical antifungal cream, ointment, or oral medications.   If you are using a cream or ointment, wash infected skin. Dry it completely before application.   Scrub the skin with a buff puff or abrasive sponge using a shampoo with ketoconazole to remove dead skin and help treat the ringworm.   Have your pet treated by your veterinarian if it has the same infection.  SEEK MEDICAL CARE IF:   Your ringworm patch (fungus) continues to spread after 7 days of treatment.   Your rash is not gone in 4 weeks. Fungal infections are slow to respond to treatment. Some redness (erythema) may remain for several weeks after the fungus is gone.   The area becomes red, warm, tender, and swollen beyond the patch. This may be a secondary bacterial (germ) infection.   You have a fever.  Document Released: 05/21/2000 Document Revised: 05/13/2011 Document Reviewed: 11/01/2008 [facility] Patient Information 2012 [place], [place].

## 2012-02-04 ENCOUNTER — Ambulatory Visit (INDEPENDENT_AMBULATORY_CARE_PROVIDER_SITE_OTHER): Payer: BC Managed Care – PPO | Admitting: Family Medicine

## 2012-02-04 ENCOUNTER — Encounter: Payer: Self-pay | Admitting: Family Medicine

## 2012-02-04 VITALS — BP 128/72 | HR 77 | Temp 97.9°F | Ht 67.0 in | Wt 273.8 lb

## 2012-02-04 DIAGNOSIS — L42 Pityriasis rosea: Secondary | ICD-10-CM

## 2012-02-04 DIAGNOSIS — R21 Rash and other nonspecific skin eruption: Secondary | ICD-10-CM

## 2012-02-04 MED ORDER — EFLORNITHINE HCL 13.9 % EX CREA: TOPICAL_CREAM | CUTANEOUS | Status: DC

## 2012-02-04 NOTE — Assessment & Plan Note (Signed)
After re eval- suspect this is pityriasis rosea-more spots/ appearance and distribution
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
## 2012-02-04 NOTE — Patient Instructions (Addendum)
I think you have pityriasis rosea  This usually gets better on its own in 1-2 months Get out in the sun a bit Stop the creams if no improvement An oral antihistamine like zyrtec may help the itch Here is a handout on it  Update me if no improvement in a month

## 2012-02-04 NOTE — Assessment & Plan Note (Signed)
Rev case with Dr Reece Agar- suspect this was pityriasis from the start and the suspected tinea was in fact a herald patch on her R arm  Disc with pt  Will give another month to clear Zyrtec for itch, some mild sun exp are ok  If not imp or worse will ref to derm Pt given handout

## 2012-03-28 ENCOUNTER — Encounter: Payer: Self-pay | Admitting: Family Medicine

## 2012-03-28 ENCOUNTER — Ambulatory Visit (INDEPENDENT_AMBULATORY_CARE_PROVIDER_SITE_OTHER): Payer: BC Managed Care – PPO | Admitting: Family Medicine

## 2012-03-28 VITALS — BP 130/80 | HR 84 | Temp 98.3°F | Ht 67.0 in | Wt 272.2 lb

## 2012-03-28 DIAGNOSIS — I10 Essential (primary) hypertension: Secondary | ICD-10-CM

## 2012-03-28 DIAGNOSIS — Z23 Encounter for immunization: Secondary | ICD-10-CM

## 2012-03-28 DIAGNOSIS — Z0289 Encounter for other administrative examinations: Secondary | ICD-10-CM

## 2012-03-28 DIAGNOSIS — Z021 Encounter for pre-employment examination: Secondary | ICD-10-CM

## 2012-03-28 NOTE — Assessment & Plan Note (Signed)
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
## 2012-03-28 NOTE — Patient Instructions (Addendum)
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

## 2012-03-30 DIAGNOSIS — Z021 Encounter for pre-employment examination: Secondary | ICD-10-CM | POA: Insufficient documentation

## 2012-03-30 NOTE — Assessment & Plan Note (Signed)
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

## 2012-04-05 ENCOUNTER — Encounter: Payer: BC Managed Care – PPO | Admitting: Obstetrics and Gynecology

## 2012-04-05 DIAGNOSIS — Z01419 Encounter for gynecological examination (general) (routine) without abnormal findings: Secondary | ICD-10-CM

## 2012-04-27 ENCOUNTER — Ambulatory Visit (HOSPITAL_BASED_OUTPATIENT_CLINIC_OR_DEPARTMENT_OTHER): Payer: BC Managed Care – PPO | Attending: Ophthalmology

## 2012-04-27 VITALS — Ht 67.0 in | Wt 269.0 lb

## 2012-04-27 DIAGNOSIS — G4733 Obstructive sleep apnea (adult) (pediatric): Secondary | ICD-10-CM | POA: Insufficient documentation

## 2012-04-30 NOTE — Procedures (Signed)
NAMERUTHMARY, Hannah Webb                   ACCOUNT NO.:  [id number]  MEDICAL RECORD NO.:  [id number]          PATIENT TYPE:  OUT  LOCATION:  SLEEP CENTER                 FACILITY:  [facility]  PHYSICIAN:  Deryck Hippler D. Maple Hudson, MD, FCCP, FACPDATE OF BIRTH:  10/22/1966  DATE OF STUDY:  04/27/2012                           NOCTURNAL POLYSOMNOGRAM  REFERRING PHYSICIAN:  Jillyn Hidden A. Rankin, M.D.  REFERRING PHYSICIAN:  Jillyn Hidden A. Rankin, MD  INDICATION FOR STUDY:  Hypersomnia with sleep apnea.  EPWORTH SLEEPINESS SCORE:  10/24.  BMI 42, weight 269 pounds, height 67 inches, neck 17 inches.  MEDICATIONS:  Home medications are charted and reviewed.  SLEEP ARCHITECTURE:  Total sleep time 306.5 minutes with sleep efficiency 79.7%.  Stage I was 6.9%, stage II 59.9%.  Stage III 10.8%, REM 22.5% of total sleep time.  Sleep latency 32.5 minutes, REM latency of 115 minutes, awake after sleep onset 38 minutes.  Arousal index 2.7. Bedtime medication:  None.  RESPIRATORY DATA:  Apnea/hypopnea index (AHI) 15.9 per hour.  A total of 81 events was scored including 7 obstructive apneas and 74 hypopneas. Events were seen in all sleep positions and REM.  REM AHI 61.7 per hour. This is a diagnostic NPSG protocol without CPAP titration.  OXYGEN DATA:  Very loud snoring with oxygen desaturation to a nadir of 79% and mean oxygen saturation through the study of 91.2% on room air. During the total study time, 29.4 minutes were recorded with room air saturation less than 90% and 12.8 minutes with saturation less than 88%.  CARDIAC DATA:  Normal sinus rhythm.  MOVEMENT/PARASOMNIA:  No significant movement disturbance.  Bathroom x1.  IMPRESSION/RECOMMENDATION: 1. Moderate obstructive sleep apnea/hypopnea syndrome, AHI 15.9 per     hour with events in all sleep positions.  Very loud snoring with     oxygen desaturation to a nadir of 79% and mean oxygen saturation     through the study of 91.2% on room air. 2. Room air oxygen  saturation on arrival was 79%.  During this study,     a total of 29.4 minutes was recorded with room air saturation less     than 90% and 12.8 minutes recorded with saturation less than 88%.     This suggests the probability of underlying cardiopulmonary     disease. 3. Consider return for dedicated CPAP titration study for evaluation     for therapeutic management and attention to medical basis for     hypoxemia if appropriate.     Giovan Pinsky D. Maple Hudson, MD, [facility], FACP Diplomate, American Board of Sleep Medicine    CDY/MEDQ  D:  04/29/2012 11:31:18  T:  04/30/2012 01:14:22  Job:  [id number]

## 2012-05-16 ENCOUNTER — Encounter (HOSPITAL_BASED_OUTPATIENT_CLINIC_OR_DEPARTMENT_OTHER): Payer: BC Managed Care – PPO

## 2012-05-22 ENCOUNTER — Encounter: Payer: Self-pay | Admitting: Pulmonary Disease

## 2012-05-22 ENCOUNTER — Ambulatory Visit (INDEPENDENT_AMBULATORY_CARE_PROVIDER_SITE_OTHER): Payer: BC Managed Care – PPO | Admitting: Pulmonary Disease

## 2012-05-22 VITALS — BP 150/100 | HR 86 | Temp 98.6°F | Ht 67.0 in | Wt 264.8 lb

## 2012-05-22 DIAGNOSIS — G4733 Obstructive sleep apnea (adult) (pediatric): Secondary | ICD-10-CM

## 2012-05-22 NOTE — Progress Notes (Signed)
Subjective:    Patient ID: Hannah Webb, female    DOB: [birth year], 46 y.o.   MRN: [id number]  HPI The patient is a 46 year old female who I've been asked to see for management of obstructive sleep apnea.  She has recently been found to have retinal disease, and the question was raised whether nocturnal desaturation could be playing a role.  Because of her history that was suggestive of sleep disordered breathing, she underwent a sleep study last month that showed mild to moderate obstructive sleep apnea.  She was found to have an AHI of 16 events per hour, with oxygen desaturation as low as 79%.  She was found to have a significant increase in events during REM.  The patient has been noted to have loud snoring by her bed partner, as well as an abnormal breathing pattern during sleep.  She has frequent awakenings at night, and is not rested in the mornings upon arising.  She notes definite sleep pressured during the day with inactivity, and will often take quick naps if she is able.  She does note slight pressure in the evenings while trying to watch television, and primarily has a fuzziness while driving longer distances.  The patient states that her weight is stable over the last 2 years, and her Epworth score today is abnormal at 14.  Sleep Questionnaire: What time do you typically go to bed?( Between what hours) 11p-12am How long does it take you to fall asleep? How many times during the night do you wake up? 4 What time do you get out of bed to start your day? 0645 Do you drive or operate heavy machinery in your occupation? No How much has your weight changed (up or down) over the past two years? (In pounds) 0 oz (0 kg) Have you ever had a sleep study before? Yes If yes, location of study? [facility] If yes, date of study? 04/27/12 Do you currently use CPAP? No Do you wear oxygen at any time? No    Review of Systems  Constitutional: Negative for fever and unexpected weight change.  HENT:  Positive for sinus pressure. Negative for ear pain, nosebleeds, congestion, sore throat, rhinorrhea, sneezing, trouble swallowing, dental problem and postnasal drip.   Eyes: Negative for redness and itching.  Respiratory: Negative for cough, chest tightness, shortness of breath and wheezing.   Cardiovascular: Negative for palpitations and leg swelling.  Gastrointestinal: Negative for nausea and vomiting.  Genitourinary: Negative for dysuria.  Musculoskeletal: Negative for joint swelling.  Skin: Negative for rash.  Neurological: Positive for headaches.  Hematological: Does not bruise/bleed easily.  Psychiatric/Behavioral: Positive for sleep disturbance ( waking up several times during night). Negative for dysphoric mood. The patient is not nervous/anxious.        Objective:   Physical Exam Constitutional:  Obese female, no acute distress  HENT:  Nares patent without discharge  Oropharynx without exudate, palate and uvula are mildly elongated.  Eyes:  Perrla, eomi, no scleral icterus  Neck:  No JVD, no TMG  Cardiovascular:  Normal rate, regular rhythm, no rubs or gallops.  No murmurs        Intact distal pulses  Pulmonary :  Normal breath sounds, no stridor or respiratory distress   No rales, rhonchi, or wheezing  Abdominal:  Soft, nondistended, bowel sounds present.  No tenderness noted.   Musculoskeletal:  No lower extremity edema noted.  Lymph Nodes:  No cervical lymphadenopathy noted  Skin:  No cyanosis noted  Neurologic:  Mildly sleepy, appropriate, moves all 4 extremities without obvious deficit.         Assessment & Plan:

## 2012-05-22 NOTE — Patient Instructions (Addendum)
Will start on cpap.  Please call if having issues with tolerance. Work on weight loss followup with me in 6 weeks.

## 2012-05-22 NOTE — Assessment & Plan Note (Signed)
The patient has only mild to moderate sleep apnea by her study, but she has significant symptoms which are interfering with her quality of life and work.  She does have a history of hypertension as well, and this can be affected by her sleep disordered breathing.  I had a long discussion with her about the pathophysiology of sleep apnea, including its impact to her quality of life and cardiovascular health.  I have outlined various treatment options, including a trial of weight loss alone, upper airway surgery, dental appliance, and also CPAP.  I have recommended that she start with a trial of CPAP while working on weight loss, and the patient is agreeable.

## 2012-07-03 ENCOUNTER — Encounter: Payer: Self-pay | Admitting: Pulmonary Disease

## 2012-07-03 ENCOUNTER — Ambulatory Visit (INDEPENDENT_AMBULATORY_CARE_PROVIDER_SITE_OTHER): Payer: BC Managed Care – PPO | Admitting: Pulmonary Disease

## 2012-07-03 VITALS — BP 138/88 | HR 76 | Temp 97.4°F | Ht 67.0 in | Wt 275.6 lb

## 2012-07-03 DIAGNOSIS — G4733 Obstructive sleep apnea (adult) (pediatric): Secondary | ICD-10-CM

## 2012-07-03 NOTE — Assessment & Plan Note (Signed)
The patient is doing very well with CPAP, and has seen improvement in her sleep and daytime alertness.  It remains to be seen whether this will help her retinal disease.  At this point, we need to optimize her CPAP pressure, and we'll do this on the automatic setting.  I have also encouraged her to work aggressively on weight loss. Care Plan:  At this point, will arrange for the patient's machine to be changed over to auto mode for 2 weeks to optimize their pressure.  I will review the downloaded data once sent by dme, and also evaluate for compliance, leaks, and residual osa.  I will call the patient and dme to discuss the results, and have the patient's machine set appropriately.  This will serve as the pt's cpap pressure titration.

## 2012-07-03 NOTE — Patient Instructions (Addendum)
Will get your pressure optimized on the automatic mode for the next few weeks.  Will call you with results. Work on weight loss followup with me in 6mos.

## 2012-07-03 NOTE — Progress Notes (Signed)
  Subjective:    Patient ID: Hannah Webb, female    DOB: 07-15-1965, 47 y.o.   MRN: [id number]  HPI The patient comes in today for followup of her sleep apnea.  She was started on CPAP at the last visit only moderate pressure, and has done well with the device.  She feels that she is sleeping much better, and has improved daytime alertness.  She is having no issues with the mask fit or pressure.   Review of Systems  Constitutional: Negative for fever and unexpected weight change.  HENT: Negative for ear pain, nosebleeds, congestion, sore throat, rhinorrhea, sneezing, trouble swallowing, dental problem, postnasal drip and sinus pressure.   Eyes: Positive for redness ( dryness from CPAP---using eye drops to moisten). Negative for itching.  Respiratory: Negative for cough, chest tightness, shortness of breath and wheezing.   Cardiovascular: Negative for palpitations and leg swelling.  Gastrointestinal: Negative for nausea and vomiting.  Genitourinary: Negative for dysuria.  Musculoskeletal: Negative for joint swelling.  Skin: Negative for rash.  Neurological: Negative for headaches.  Hematological: Does not bruise/bleed easily.  Psychiatric/Behavioral: Negative for dysphoric mood. The patient is not nervous/anxious.        Objective:   Physical Exam Obese female in no acute distress Nose without purulence or discharge noted No skin breakdown or pressure necrosis from the CPAP mask Lower extremities without significant edema, no cyanosis Alert and oriented, moves all 4 extremities.  Does not appear to be sleepy.       Assessment & Plan:

## 2012-07-13 ENCOUNTER — Telehealth: Payer: Self-pay | Admitting: Pulmonary Disease

## 2012-07-13 NOTE — Telephone Encounter (Signed)
Forward 4 pages from [facility] to Dr. Marcelyn Bruins for review on 07-13-12 ym

## 2012-07-17 NOTE — Telephone Encounter (Signed)
These records have been placed in your Pink folder for review.

## 2012-09-12 ENCOUNTER — Telehealth: Payer: Self-pay | Admitting: Pulmonary Disease

## 2012-09-12 NOTE — Telephone Encounter (Signed)
Please advise ashtyn thanks 

## 2012-09-12 NOTE — Telephone Encounter (Signed)
Results have been requested from [facility]--being faxed to [phone]

## 2012-09-12 NOTE — Telephone Encounter (Signed)
We have never received the download

## 2012-09-12 NOTE — Telephone Encounter (Signed)
I spoke with pt. She is wanting to know if we received her CPAP results. Per pt this was done back in January. please advise Ashtyn and Dr. Shelle Iron thanks

## 2012-09-13 NOTE — Telephone Encounter (Signed)
Will call [facility] back in AM [phone] Left msg with call center

## 2012-09-13 NOTE — Telephone Encounter (Signed)
Please advise Hannah Webb thanks 

## 2012-09-13 NOTE — Telephone Encounter (Signed)
CPAP Download in Hannah Webb folder for review. Patient requesting results.

## 2012-09-13 NOTE — Telephone Encounter (Signed)
The download in my folder is the COMPLIANCE download on 10cm, not the download on auto mode for 2 weeks to determine pressure. Please call dme back and try to get right download.  Thanks.

## 2012-09-14 NOTE — Telephone Encounter (Signed)
Called [facility], spoke with Selena Batten who had just returned from pt's home Per Selena Batten, pt's CPAP was not set on auto but 10 cm Setting was changed to auto and Selena Batten will return to pt's home in 2 weeks' time to obtain DL and fax it to [facility] Per Selena Batten, pt is aware of all this Will sign and forward to [facility] as FYI

## 2012-11-13 ENCOUNTER — Encounter: Payer: Self-pay | Admitting: Pulmonary Disease

## 2012-11-13 ENCOUNTER — Telehealth: Payer: Self-pay | Admitting: Pulmonary Disease

## 2012-11-13 DIAGNOSIS — G4733 Obstructive sleep apnea (adult) (pediatric): Secondary | ICD-10-CM

## 2012-11-13 NOTE — Telephone Encounter (Signed)
Per phone msg from 09/12/12:  Call Documentation    Sherre Lain, MA at 09/14/2012 1:03 PM    Status: Signed             Called [facility], spoke with Selena Batten who had just returned from pt's home  Per Selena Batten, pt's CPAP was not set on auto but 10 cm  Setting was changed to auto and Selena Batten will return to pt's home in 2 weeks' time to obtain DL and fax it to [facility]  Per Selena Batten, pt is aware of all this  Will sign and forward to [facility] as FYI   -------  Called, spoke with pt.  States [facility] did return to her home in April after cpap was set on auto to get her card.  She has received the card back in the mail from [facility], but hasn't heard anything regarding these results.  She doesn't believe [facility] sent [place] the report.  She would like to know the results and recs from [facility].  Dr. Shelle Iron, pls advise if you have seen the 2 wk auto report on pt.  Thank you.

## 2012-11-13 NOTE — Telephone Encounter (Signed)
Pt states she will try teh set pressure of 13 cm. Order sent to [facility].

## 2012-11-13 NOTE — Telephone Encounter (Signed)
Let pt know that I did find her download, and her optimal pressure appears to be 13cm.   We can leave her on auto, or change her machine to a fixed pressure of 13cm.

## 2012-12-27 ENCOUNTER — Ambulatory Visit: Payer: BC Managed Care – PPO | Admitting: Pulmonary Disease

## 2013-01-01 ENCOUNTER — Ambulatory Visit: Payer: BC Managed Care – PPO | Admitting: Pulmonary Disease

## 2013-01-05 ENCOUNTER — Ambulatory Visit: Payer: BC Managed Care – PPO | Admitting: Pulmonary Disease

## 2013-01-08 ENCOUNTER — Other Ambulatory Visit: Payer: Self-pay | Admitting: *Deleted

## 2013-01-08 MED ORDER — HYDROCHLOROTHIAZIDE 25 MG PO TABS: ORAL_TABLET | ORAL | Status: DC

## 2013-01-08 MED ORDER — NIFEDIPINE ER OSMOTIC RELEASE 90 MG PO TB24: ORAL_TABLET | ORAL | Status: DC

## 2013-01-08 MED ORDER — METFORMIN HCL ER 750 MG PO TB24: ORAL_TABLET | ORAL | Status: DC

## 2013-01-29 ENCOUNTER — Ambulatory Visit: Payer: BC Managed Care – PPO | Admitting: Pulmonary Disease

## 2013-02-01 ENCOUNTER — Encounter: Payer: Self-pay | Admitting: Pulmonary Disease

## 2013-06-01 ENCOUNTER — Other Ambulatory Visit: Payer: Self-pay

## 2013-06-01 DIAGNOSIS — Z1231 Encounter for screening mammogram for malignant neoplasm of breast: Secondary | ICD-10-CM

## 2013-06-04 ENCOUNTER — Other Ambulatory Visit: Payer: Self-pay | Admitting: Family Medicine

## 2013-06-05 NOTE — Telephone Encounter (Signed)
Pt has f/u tomorrow, we will refill Rx then

## 2013-06-06 ENCOUNTER — Encounter: Payer: Self-pay | Admitting: Family Medicine

## 2013-06-06 ENCOUNTER — Ambulatory Visit (INDEPENDENT_AMBULATORY_CARE_PROVIDER_SITE_OTHER): Payer: BC Managed Care – PPO | Admitting: Family Medicine

## 2013-06-06 VITALS — BP 134/82 | HR 90 | Temp 98.8°F | Ht 67.0 in | Wt 281.0 lb

## 2013-06-06 DIAGNOSIS — Z23 Encounter for immunization: Secondary | ICD-10-CM

## 2013-06-06 DIAGNOSIS — E348 Other specified endocrine disorders: Secondary | ICD-10-CM

## 2013-06-06 DIAGNOSIS — E785 Hyperlipidemia, unspecified: Secondary | ICD-10-CM

## 2013-06-06 DIAGNOSIS — I10 Essential (primary) hypertension: Secondary | ICD-10-CM

## 2013-06-06 LAB — COMPREHENSIVE METABOLIC PANEL
ALT: 37 U/L — ABNORMAL HIGH (ref 0–35)
AST: 30 U/L (ref 0–37)
BUN: 13 mg/dL (ref 6–23)
CO2: 28 mEq/L (ref 19–32)
Calcium: 9.3 mg/dL (ref 8.4–10.5)
Chloride: 101 mEq/L (ref 96–112)
GFR: 65.87 mL/min (ref >60.00)
Sodium: 138 mEq/L (ref 135–145)
Total Bilirubin: 0.7 mg/dL (ref 0.3–1.2)
Total Protein: 7.5 g/dL (ref 6.0–8.3)

## 2013-06-06 LAB — CBC WITH DIFFERENTIAL/PLATELET
Basophils Absolute: 0 10*3/uL (ref 0.0–0.1)
Eosinophils Relative: 3.6 % (ref 0.0–5.0)
Hemoglobin: 14.5 g/dL (ref 12.0–15.0)
Lymphocytes Relative: 25.4 % (ref 12.0–46.0)
MCV: 85.5 fl (ref 78.0–100.0)
Monocytes Relative: 5.7 % (ref 3.0–12.0)
Neutro Abs: 7.5 10*3/uL (ref 1.4–7.7)
Platelets: 317 10*3/uL (ref 150.0–400.0)
RBC: 5.1 Mil/uL (ref 3.87–5.11)
RDW: 15.6 % — ABNORMAL HIGH (ref 11.5–14.6)
WBC: 11.6 10*3/uL — ABNORMAL HIGH (ref 4.5–10.5)

## 2013-06-06 LAB — HEMOGLOBIN A1C: Hgb A1c MFr Bld: 6.3 % (ref 4.6–6.5)

## 2013-06-06 LAB — LIPID PANEL: VLDL: 21.8 mg/dL (ref 0.0–40.0)

## 2013-06-06 MED ORDER — HYDROCHLOROTHIAZIDE 25 MG PO TABS: ORAL_TABLET | ORAL | Status: DC

## 2013-06-06 MED ORDER — NIFEDIPINE ER OSMOTIC RELEASE 90 MG PO TB24: ORAL_TABLET | ORAL | Status: DC

## 2013-06-06 MED ORDER — METFORMIN HCL ER 750 MG PO TB24: ORAL_TABLET | ORAL | Status: DC

## 2013-06-06 NOTE — Patient Instructions (Signed)
Flu shot today Work on healthy diet and exercise and weight loss Lab today
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
## 2013-06-06 NOTE — Progress Notes (Signed)
Pre-visit discussion using our clinic review tool. No additional management support is needed unless otherwise documented below in the visit note.  

## 2013-06-07 NOTE — Assessment & Plan Note (Signed)
BP: 134/82 mmHg  bp in fair control at this time - though would improve with wt loss  No changes needed Disc lifstyle change with low sodium diet and exercise

## 2013-06-07 NOTE — Assessment & Plan Note (Signed)
Check lipids today Diet not optimal  Disc goals for lipids and reasons to control them Rev labs with pt from last draw  Rev low sat fat diet in detail

## 2013-06-07 NOTE — Assessment & Plan Note (Signed)
A1C today - pt has PCOS and obesity- at risk for DM

## 2013-06-08 ENCOUNTER — Other Ambulatory Visit: Payer: Self-pay | Admitting: *Deleted

## 2013-06-08 LAB — LDL CHOLESTEROL, DIRECT: Direct LDL: 157.7 mg/dL

## 2013-06-08 MED ORDER — NIFEDIPINE ER OSMOTIC RELEASE 90 MG PO TB24: ORAL_TABLET | ORAL | Status: DC

## 2013-06-08 MED ORDER — HYDROCHLOROTHIAZIDE 25 MG PO TABS: ORAL_TABLET | ORAL | Status: DC

## 2013-06-08 NOTE — Telephone Encounter (Signed)
Pt is out of her medication and is waiting for [facility] to send Rx, in the mean time [facility] pt [facility]

## 2013-06-28 ENCOUNTER — Ambulatory Visit: Payer: BC Managed Care – PPO

## 2013-06-29 ENCOUNTER — Ambulatory Visit: Payer: BC Managed Care – PPO

## 2013-07-23 ENCOUNTER — Ambulatory Visit: Payer: BC Managed Care – PPO

## 2013-08-08 ENCOUNTER — Ambulatory Visit
Admission: RE | Admit: 2013-08-08 | Discharge: 2013-08-08 | Disposition: A | Discharge status: Home / Self Care | Payer: BC Managed Care – PPO | Source: Ambulatory Visit

## 2013-08-08 DIAGNOSIS — Z1231 Encounter for screening mammogram for malignant neoplasm of breast: Secondary | ICD-10-CM

## 2013-08-10 ENCOUNTER — Encounter: Payer: Self-pay | Admitting: Family Medicine

## 2013-08-27 ENCOUNTER — Telehealth: Payer: Self-pay | Admitting: Family Medicine

## 2013-08-27 DIAGNOSIS — E348 Other specified endocrine disorders: Secondary | ICD-10-CM

## 2013-08-27 DIAGNOSIS — I10 Essential (primary) hypertension: Secondary | ICD-10-CM

## 2013-08-27 DIAGNOSIS — E785 Hyperlipidemia, unspecified: Secondary | ICD-10-CM

## 2013-08-27 NOTE — Telephone Encounter (Signed)
Message copied by Judy PimpleWER, Aliceson Dolbow A on Mon Aug 27, 2013  6:14 PM ------      Message from: Alvina ChouWALSH, TERRI J      Created: Mon Aug 27, 2013  3:32 PM      Regarding: Lab orders for Thursday, 4.2.15       Lab orders for a f/u ------

## 2013-09-06 ENCOUNTER — Other Ambulatory Visit: Payer: BC Managed Care – PPO

## 2013-09-14 ENCOUNTER — Ambulatory Visit: Payer: BC Managed Care – PPO | Admitting: Family Medicine

## 2013-09-17 ENCOUNTER — Ambulatory Visit: Payer: BC Managed Care – PPO | Admitting: Family Medicine

## 2013-09-17 DIAGNOSIS — Z0289 Encounter for other administrative examinations: Secondary | ICD-10-CM

## 2014-03-21 ENCOUNTER — Ambulatory Visit: Payer: BC Managed Care – PPO | Admitting: Family Medicine

## 2014-03-25 ENCOUNTER — Ambulatory Visit: Payer: BC Managed Care – PPO | Admitting: Family Medicine

## 2014-04-01 ENCOUNTER — Encounter: Payer: Self-pay | Admitting: Family Medicine

## 2014-04-01 ENCOUNTER — Ambulatory Visit (INDEPENDENT_AMBULATORY_CARE_PROVIDER_SITE_OTHER)
Admission: RE | Admit: 2014-04-01 | Discharge: 2014-04-01 | Disposition: A | Discharge status: Home / Self Care | Payer: BC Managed Care – PPO | Source: Ambulatory Visit | Attending: Family Medicine | Admitting: Family Medicine

## 2014-04-01 ENCOUNTER — Ambulatory Visit (INDEPENDENT_AMBULATORY_CARE_PROVIDER_SITE_OTHER): Payer: BC Managed Care – PPO | Admitting: Family Medicine

## 2014-04-01 VITALS — BP 140/96 | HR 76 | Temp 98.3°F | Ht 67.0 in | Wt 279.5 lb

## 2014-04-01 DIAGNOSIS — M25561 Pain in right knee: Secondary | ICD-10-CM

## 2014-04-01 MED ORDER — METHYLPREDNISOLONE ACETATE 40 MG/ML IJ SUSP
80.0000 mg | Freq: Once | INTRAMUSCULAR | Status: AC
  Administered 2014-04-01: 80 mg via INTRA_ARTICULAR

## 2014-04-01 NOTE — Progress Notes (Signed)
Dr. Karleen HampshireSpencer T. Aeneas Longsworth, MD, CAQ [facility] [address] [place], [address] Phone: [phone] Fax: [phone]  04/01/2014  Patient: Hannah Webb, MRN: [id number], DOB: 10/08/1965, 48 y.o.  Primary Physician:  Roxy MannsMarne Tower, MD  Chief Complaint: Knee Pain  Subjective:   Hannah Webb is a 48 y.o. very pleasant female patient Body mass index is 43.77 kg/(m^2).  who presents with the following:  Patient presents with 1 mo h/o R sided knee pain after stepping down stairs. + audible pop was heard. The patient has had an effusion. No symptomatic giving-way. No mechanical clicking. Joint has not locked up. Patient has been able to walk but is limping. The patient does have pain going up and down stairs or rising from a seated position.   1 month ago RIGHT KNEE, going up stair and stepped down and felt a pop. Felt something go down stairs. Feel like going to give. Was really swollen.  Pain location: anteromedial Current physical activity: none Prior Knee Surgery: none Current pain meds: motrin Bracing: none   Past Medical History, Surgical History, Social History, Family History, Problem List, Medications, and Allergies have been reviewed and updated if relevant.  GEN: No fevers, chills. Nontoxic. Primarily MSK c/o today. MSK: Detailed in the HPI GI: tolerating PO intake without difficulty Neuro: No numbness, parasthesias, or tingling associated. Otherwise the pertinent positives of the ROS are noted above.   Objective:   BP 140/96  Pulse 76  Temp(Src) 98.3 F (36.8 C) (Oral)  Ht 5\' 7"  (1.702 m)  Wt 279 lb 8 oz (126.78 kg)  BMI 43.77 kg/m2   GEN: WDWN, NAD, Non-toxic, Alert & Oriented x 3 HEENT: Atraumatic, Normocephalic.  Ears and Nose: No external deformity. EXTR: No clubbing/cyanosis/edema PSYCH: Normally interactive. Conversant. Not depressed or anxious appearing.  Calm demeanor.   Knee:  R Gait: Normal heel toe pattern,  antalgia ROM: 0-115 Effusion: mild Echymosis or edema: none Patellar tendon NT Painful PLICA: neg Patellar grind: negative Medial and lateral patellar facet loading: negative medial and lateral joint lines: medial joint line tenderness Mcmurray's neg Flexion-pinch neg Varus and valgus stress: stable Lachman: neg Ant and Post drawer: neg Hip abduction, IR, ER: WNL Hip flexion str: 5/5 Hip abd: 5/5 Quad: 5/5 VMO atrophy:No Hamstring concentric and eccentric: 5/5   Radiology: Dg Knee Ap/lat W/sunrise Right  04/01/2014   CLINICAL DATA:  Right knee pain for 6 weeks, knee makes popping sound walking up stairs  EXAM: DG KNEE - 3 VIEWS  COMPARISON:  None.  FINDINGS: No significant degenerative joint disease is seen. Joint spaces are relatively normal for age with minimal spurring present. No fracture is seen and no joint effusion is noted.  IMPRESSION: Negative.   Electronically Signed   By: Dwyane DeePaul  Barry M.D.   On: 04/01/2014 08:44    Assessment and Plan:   Right knee pain - Plan: DG Knee AP/LAT W/Sunrise Right, methylPREDNISolone acetate (DEPO-MEDROL) injection 80 mg  Ligaments feel intact. Minimal to no OA on film. More likely meniscal tear. Trial of continued conservative care with recheck.  Knee Injection, R Patient verbally consented to procedure. Risks (including potential rare risk of infection), benefits, and alternatives explained. Sterilely prepped with Chloraprep. Ethyl cholride used for anesthesia. 8 cc Lidocaine 1% mixed with Depo-Medrol 80 mg injected using the anteromedial approach without difficulty. No complications with procedure and tolerated well. Patient had decreased pain post-injection.   Follow-up: Return in about 1 month (  around 05/02/2014).  New Prescriptions   No medications on file   Orders Placed This Encounter  Procedures  . DG Knee AP/LAT W/Sunrise Right    Signed,  Miki Blank T. Cambell Stanek, MD   Patient's Medications  New Prescriptions   No  medications on file  Previous Medications   HYDROCHLOROTHIAZIDE (HYDRODIURIL) 25 MG TABLET    TAKE 1 TABLET DAILY   METFORMIN (GLUCOPHAGE-XR) 750 MG 24 HR TABLET    TAKE 1 TABLET TWICE A DAY   NIFEDIPINE (PROCARDIA XL/ADALAT-CC) 90 MG 24 HR TABLET    TAKE 1 TABLET DAILY  Modified Medications   No medications on file  Discontinued Medications   No medications on file

## 2014-04-01 NOTE — Patient Instructions (Signed)
Motrin 600 - 800 mg recommended TID. (Over the counter Motrin, Advil, or Generic Ibuprofen 200 mg tablets. 3-4 tablets by mouth 3 times a day. This equals a prescription strength dose.)  

## 2014-04-01 NOTE — Progress Notes (Signed)
Pre visit review using our clinic review tool, if applicable. No additional management support is needed unless otherwise documented below in the visit note. 

## 2014-04-04 ENCOUNTER — Ambulatory Visit: Payer: BC Managed Care – PPO

## 2014-05-13 ENCOUNTER — Telehealth: Payer: Self-pay | Admitting: Family Medicine

## 2014-05-13 ENCOUNTER — Ambulatory Visit: Payer: BC Managed Care – PPO | Admitting: Family Medicine

## 2014-05-13 NOTE — Telephone Encounter (Signed)
Patient did not come for their scheduled appointment today for 1 month follow up Please let me know if the patient needs to be contacted immediately for follow up or if no follow up is necessary.   ° °

## 2014-05-13 NOTE — Telephone Encounter (Signed)
No f/u needed 

## 2014-06-12 ENCOUNTER — Telehealth: Payer: Self-pay | Admitting: Family Medicine

## 2014-06-12 NOTE — Telephone Encounter (Signed)
Please call and check in with her  Schedule f/u and refill until then if she wants to come back  Thanks

## 2014-06-12 NOTE — Telephone Encounter (Signed)
Electronic refill request, pt has cancelled or no showed most of her appt., and has no future appt., please advise (see appt history)

## 2014-06-13 NOTE — Telephone Encounter (Signed)
Left voicemail requesting pt to call office 

## 2014-06-14 NOTE — Telephone Encounter (Signed)
appt scheduled and meds refilled 

## 2014-06-14 NOTE — Telephone Encounter (Signed)
Pt returned your call   #[phone]

## 2014-06-24 ENCOUNTER — Ambulatory Visit: Payer: Self-pay | Admitting: Family Medicine

## 2014-07-01 ENCOUNTER — Ambulatory Visit: Payer: Self-pay | Admitting: Family Medicine

## 2014-07-08 ENCOUNTER — Ambulatory Visit: Payer: Self-pay | Admitting: Family Medicine

## 2014-07-09 ENCOUNTER — Encounter: Payer: Self-pay | Admitting: Family Medicine

## 2014-07-09 ENCOUNTER — Ambulatory Visit (INDEPENDENT_AMBULATORY_CARE_PROVIDER_SITE_OTHER): Payer: BLUE CROSS/BLUE SHIELD | Admitting: Family Medicine

## 2014-07-09 VITALS — BP 140/90 | HR 65 | Temp 97.8°F | Ht 67.0 in | Wt 227.8 lb

## 2014-07-09 DIAGNOSIS — Z23 Encounter for immunization: Secondary | ICD-10-CM

## 2014-07-09 DIAGNOSIS — E785 Hyperlipidemia, unspecified: Secondary | ICD-10-CM

## 2014-07-09 DIAGNOSIS — J069 Acute upper respiratory infection, unspecified: Secondary | ICD-10-CM

## 2014-07-09 DIAGNOSIS — R739 Hyperglycemia, unspecified: Secondary | ICD-10-CM

## 2014-07-09 DIAGNOSIS — I10 Essential (primary) hypertension: Secondary | ICD-10-CM

## 2014-07-09 LAB — CBC WITH DIFFERENTIAL/PLATELET
BASOS ABS: 0 10*3/uL (ref 0.0–0.1)
Basophils Relative: 0.5 % (ref 0.0–3.0)
EOS ABS: 0.4 10*3/uL (ref 0.0–0.7)
Eosinophils Relative: 5.2 % — ABNORMAL HIGH (ref 0.0–5.0)
HCT: 41.9 % (ref 36.0–46.0)
Hemoglobin: 14.1 g/dL (ref 12.0–15.0)
Lymphocytes Relative: 26.9 % (ref 12.0–46.0)
Lymphs Abs: 2.3 10*3/uL (ref 0.7–4.0)
MCHC: 33.7 g/dL (ref 30.0–36.0)
MCV: 83.5 fl (ref 78.0–100.0)
Monocytes Absolute: 0.5 10*3/uL (ref 0.1–1.0)
Monocytes Relative: 5.4 % (ref 3.0–12.0)
NEUTROS PCT: 62 % (ref 43.0–77.0)
Neutro Abs: 5.3 10*3/uL (ref 1.4–7.7)
Platelets: 295 10*3/uL (ref 150.0–400.0)
RBC: 5.02 Mil/uL (ref 3.87–5.11)
RDW: 15.7 % — AB (ref 11.5–15.5)
WBC: 8.6 10*3/uL (ref 4.0–10.5)

## 2014-07-09 LAB — COMPREHENSIVE METABOLIC PANEL
ALBUMIN: 3.9 g/dL (ref 3.5–5.2)
ALT: 30 U/L (ref 0–35)
AST: 26 U/L (ref 0–37)
Alkaline Phosphatase: 91 U/L (ref 39–117)
BUN: 10 mg/dL (ref 6–23)
CO2: 29 mEq/L (ref 19–32)
Calcium: 9.3 mg/dL (ref 8.4–10.5)
Chloride: 106 mEq/L (ref 96–112)
Creatinine, Ser: 0.82 mg/dL (ref 0.40–1.20)
GFR: 95.88 mL/min (ref >60.00)
GLUCOSE: 104 mg/dL — AB (ref 70–99)
POTASSIUM: 3.6 meq/L (ref 3.5–5.1)
SODIUM: 140 meq/L (ref 135–145)
TOTAL PROTEIN: 6.8 g/dL (ref 6.0–8.3)
Total Bilirubin: 0.5 mg/dL (ref 0.2–1.2)

## 2014-07-09 LAB — LIPID PANEL
CHOL/HDL RATIO: 5
Cholesterol: 194 mg/dL (ref 0–200)
HDL: 41.7 mg/dL (ref >39.00)
LDL Cholesterol: 130 mg/dL — ABNORMAL HIGH (ref 0–99)
NONHDL: 152.3
Triglycerides: 114 mg/dL (ref 0.0–149.0)
VLDL: 22.8 mg/dL (ref 0.0–40.0)

## 2014-07-09 LAB — HEMOGLOBIN A1C: Hgb A1c MFr Bld: 6.4 % (ref 4.6–6.5)

## 2014-07-09 LAB — TSH: TSH: 1.14 u[IU]/mL (ref 0.35–4.50)

## 2014-07-09 MED ORDER — HYDROCHLOROTHIAZIDE 25 MG PO TABS: 25.0000 mg | ORAL_TABLET | Freq: Every day | ORAL | Status: DC

## 2014-07-09 MED ORDER — EFLORNITHINE HCL 13.9 % EX CREA: TOPICAL_CREAM | CUTANEOUS | Status: DC

## 2014-07-09 MED ORDER — NIFEDIPINE ER OSMOTIC RELEASE 90 MG PO TB24
90.0000 mg | ORAL_TABLET | Freq: Every day | ORAL | Status: DC
Start: 2014-07-09 — End: 2015-05-30

## 2014-07-09 MED ORDER — METFORMIN HCL ER 750 MG PO TB24: ORAL_TABLET | ORAL | Status: DC
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
## 2014-07-09 NOTE — Assessment & Plan Note (Signed)
Not quite at goal BP: 140/90 mmHg  Will continue diet/exercise for wt loss If not imp at next visit -consider change bp med

## 2014-07-09 NOTE — Progress Notes (Signed)
Pre visit review using our clinic review tool, if applicable. No additional management support is needed unless otherwise documented below in the visit note. 

## 2014-07-09 NOTE — Assessment & Plan Note (Signed)
On metformin for insulin resistance from pcos  Will continue this A1C today and chem profile Commended on lower sugar diet  Disc exercise plan

## 2014-07-09 NOTE — Assessment & Plan Note (Signed)
Lipid profile today Disc goals for lipids and reasons to control them Rev labs with pt from last check - she does eat red meat Rev low sat fat diet in detail

## 2014-07-09 NOTE — Patient Instructions (Signed)
Keep up the good work with diet  Try to exercise more  Keep working on weight loss  Drink fluids and rest for cold - otc meds are ok except for sudafed  Lab today  Follow up in about 6 months for blood pressure

## 2014-07-10 ENCOUNTER — Encounter: Payer: Self-pay | Admitting: *Deleted

## 2014-07-10 DIAGNOSIS — J069 Acute upper respiratory infection, unspecified: Secondary | ICD-10-CM | POA: Insufficient documentation

## 2014-07-10 NOTE — Assessment & Plan Note (Signed)
Re assuring exam  Disc symptomatic care - see instructions on AVS

## 2014-12-26 ENCOUNTER — Other Ambulatory Visit: Payer: Self-pay | Admitting: Family Medicine

## 2014-12-26 ENCOUNTER — Other Ambulatory Visit: Payer: Self-pay

## 2014-12-26 DIAGNOSIS — Z1231 Encounter for screening mammogram for malignant neoplasm of breast: Secondary | ICD-10-CM

## 2015-01-10 ENCOUNTER — Telehealth: Payer: Self-pay | Admitting: Family Medicine

## 2015-01-10 ENCOUNTER — Ambulatory Visit: Payer: BLUE CROSS/BLUE SHIELD | Admitting: Family Medicine

## 2015-01-10 NOTE — Telephone Encounter (Signed)
Patient did not come in for their appointment today for 6 mo follow up  Please let me know if patient needs to be contacted immediately for follow up or no follow up needed. °

## 2015-01-10 NOTE — Telephone Encounter (Signed)
Please remind her to make a f/u appt at her convenience -no rush

## 2015-01-13 NOTE — Telephone Encounter (Signed)
R/s for 9/16. Pt aware

## 2015-01-23 ENCOUNTER — Other Ambulatory Visit: Payer: Self-pay | Admitting: Family Medicine

## 2015-01-30 ENCOUNTER — Ambulatory Visit: Payer: BLUE CROSS/BLUE SHIELD

## 2015-02-21 ENCOUNTER — Ambulatory Visit: Payer: BLUE CROSS/BLUE SHIELD | Admitting: Family Medicine

## 2015-02-21 ENCOUNTER — Telehealth: Payer: Self-pay | Admitting: Family Medicine

## 2015-02-21 NOTE — Telephone Encounter (Signed)
Pt did not come in for their appt today for follow up. Please let me know if pt needs to be contacted immediately for follow up or no follow up needed. Best phone number to contact pt is [phone].

## 2015-02-21 NOTE — Telephone Encounter (Signed)
She tends to no show - we can just let her call when ready to re schedule

## 2015-04-23 ENCOUNTER — Other Ambulatory Visit: Payer: Self-pay | Admitting: Family Medicine

## 2015-04-23 NOTE — Telephone Encounter (Signed)
Pt no showed last 2 appt., she is due for a f/u with Dr. Milinda Antisower, called pt to schedule appt and no answer and her voicemail box is full

## 2015-05-05 NOTE — Telephone Encounter (Signed)
Tried to call pt again and voicemail still full Rx declined and pharmacy notified pt needs appt

## 2015-05-30 ENCOUNTER — Ambulatory Visit (INDEPENDENT_AMBULATORY_CARE_PROVIDER_SITE_OTHER): Payer: BLUE CROSS/BLUE SHIELD | Admitting: Family Medicine

## 2015-05-30 ENCOUNTER — Encounter: Payer: Self-pay | Admitting: Family Medicine

## 2015-05-30 VITALS — BP 150/110 | HR 73 | Temp 97.7°F | Ht 67.0 in | Wt 280.8 lb

## 2015-05-30 DIAGNOSIS — I10 Essential (primary) hypertension: Secondary | ICD-10-CM

## 2015-05-30 DIAGNOSIS — E785 Hyperlipidemia, unspecified: Secondary | ICD-10-CM

## 2015-05-30 DIAGNOSIS — R739 Hyperglycemia, unspecified: Secondary | ICD-10-CM | POA: Diagnosis not present

## 2015-05-30 MED ORDER — NIFEDIPINE ER OSMOTIC RELEASE 90 MG PO TB24: 90.0000 mg | ORAL_TABLET | Freq: Every day | ORAL | Status: DC

## 2015-05-30 MED ORDER — HYDROCHLOROTHIAZIDE 25 MG PO TABS: 25.0000 mg | ORAL_TABLET | Freq: Every day | ORAL | Status: DC

## 2015-05-30 MED ORDER — EFLORNITHINE HCL 13.9 % EX CREA: TOPICAL_CREAM | CUTANEOUS | Status: DC

## 2015-05-30 MED ORDER — METFORMIN HCL ER 750 MG PO TB24: ORAL_TABLET | ORAL | Status: DC

## 2015-05-30 NOTE — Progress Notes (Signed)
Pre visit review using our clinic review tool, if applicable. No additional management support is needed unless otherwise documented below in the visit note. 
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
## 2015-05-30 NOTE — Patient Instructions (Signed)
I sent medications to your pharmacies  Take care of yourself  Hopefully the bp will go down with medication Also work on healthy diet and exercise and weight loss  Schedule follow up in Feb with labs prior

## 2015-06-02 NOTE — Assessment & Plan Note (Signed)
Discussed how this problem influences overall health and the risks it imposes  Reviewed plan for weight loss with lower calorie diet (via better food choices and also portion control or program like weight watchers) and exercise building up to or more than 30 minutes 5 days per week including some aerobic activity    

## 2015-06-02 NOTE — Assessment & Plan Note (Signed)
Lab Results  Component Value Date   HGBA1C 6.4 07/09/2014   hx of PCOS- took metformin xr Check before f/u in feb Enc low glycemic diet and wt loss to prevent DM

## 2015-06-02 NOTE — Assessment & Plan Note (Signed)
Disc goals for lipids and reasons to control them Rev labs with pt= from last draw  Rev low sat fat diet in detail Check lipids before f/u in feb

## 2015-06-02 NOTE — Assessment & Plan Note (Signed)
bp is not well controlled since running out of medicine - hctz and nifedipine Will re start bp medications and f/u in Feb (with lab prior) Enc better diet and exercise and weight loss in detail

## 2015-06-25 ENCOUNTER — Other Ambulatory Visit: Payer: Self-pay | Admitting: Family Medicine

## 2015-07-14 ENCOUNTER — Other Ambulatory Visit (INDEPENDENT_AMBULATORY_CARE_PROVIDER_SITE_OTHER): Payer: BLUE CROSS/BLUE SHIELD

## 2015-07-14 DIAGNOSIS — R739 Hyperglycemia, unspecified: Secondary | ICD-10-CM | POA: Diagnosis not present

## 2015-07-14 DIAGNOSIS — E785 Hyperlipidemia, unspecified: Secondary | ICD-10-CM | POA: Diagnosis not present

## 2015-07-14 DIAGNOSIS — I10 Essential (primary) hypertension: Secondary | ICD-10-CM

## 2015-07-14 LAB — COMPREHENSIVE METABOLIC PANEL
ALK PHOS: 118 U/L — AB (ref 39–117)
ALT: 20 U/L (ref 0–35)
AST: 15 U/L (ref 0–37)
Albumin: 3.9 g/dL (ref 3.5–5.2)
BILIRUBIN TOTAL: 0.3 mg/dL (ref 0.2–1.2)
BUN: 10 mg/dL (ref 6–23)
CALCIUM: 9.4 mg/dL (ref 8.4–10.5)
CO2: 28 mEq/L (ref 19–32)
CREATININE: 1.04 mg/dL (ref 0.40–1.20)
Chloride: 104 mEq/L (ref 96–112)
GFR: 72.57 mL/min (ref >60.00)
Glucose, Bld: 120 mg/dL — ABNORMAL HIGH (ref 70–99)
Potassium: 3.3 mEq/L — ABNORMAL LOW (ref 3.5–5.1)
Sodium: 141 mEq/L (ref 135–145)
TOTAL PROTEIN: 7.4 g/dL (ref 6.0–8.3)

## 2015-07-14 LAB — CBC WITH DIFFERENTIAL/PLATELET
BASOS ABS: 0 10*3/uL (ref 0.0–0.1)
Basophils Relative: 0.4 % (ref 0.0–3.0)
EOS ABS: 0.4 10*3/uL (ref 0.0–0.7)
Eosinophils Relative: 3.2 % (ref 0.0–5.0)
HEMATOCRIT: 44.2 % (ref 36.0–46.0)
HEMOGLOBIN: 14.4 g/dL (ref 12.0–15.0)
LYMPHS PCT: 24.4 % (ref 12.0–46.0)
Lymphs Abs: 2.8 10*3/uL (ref 0.7–4.0)
MCHC: 32.5 g/dL (ref 30.0–36.0)
MCV: 84.1 fl (ref 78.0–100.0)
MONOS PCT: 5.4 % (ref 3.0–12.0)
Monocytes Absolute: 0.6 10*3/uL (ref 0.1–1.0)
NEUTROS ABS: 7.8 10*3/uL — AB (ref 1.4–7.7)
Neutrophils Relative %: 66.6 % (ref 43.0–77.0)
Platelets: 360 10*3/uL (ref 150.0–400.0)
RBC: 5.26 Mil/uL — AB (ref 3.87–5.11)
RDW: 16.4 % — ABNORMAL HIGH (ref 11.5–15.5)
WBC: 11.6 10*3/uL — AB (ref 4.0–10.5)

## 2015-07-14 LAB — LIPID PANEL
CHOL/HDL RATIO: 4
CHOLESTEROL: 185 mg/dL (ref 0–200)
HDL: 51.9 mg/dL (ref >39.00)
LDL CALC: 112 mg/dL — AB (ref 0–99)
NonHDL: 133.53
TRIGLYCERIDES: 106 mg/dL (ref 0.0–149.0)
VLDL: 21.2 mg/dL (ref 0.0–40.0)

## 2015-07-14 LAB — TSH: TSH: 1.75 u[IU]/mL (ref 0.35–4.50)

## 2015-07-14 LAB — HEMOGLOBIN A1C: Hgb A1c MFr Bld: 6.2 % (ref 4.6–6.5)

## 2015-07-28 ENCOUNTER — Telehealth: Payer: Self-pay | Admitting: Family Medicine

## 2015-07-28 ENCOUNTER — Ambulatory Visit: Payer: BLUE CROSS/BLUE SHIELD | Admitting: Family Medicine

## 2015-07-28 NOTE — Telephone Encounter (Signed)
Patient did not come for their scheduled appointment today for feb follow up Please let me know if the patient needs to be contacted immediately for follow up or if no follow up is necessary.

## 2015-07-28 NOTE — Telephone Encounter (Signed)
Yes- we still need to see her -please re schedule

## 2015-07-29 NOTE — Telephone Encounter (Signed)
Appointment 2/234 pt aware

## 2015-08-01 ENCOUNTER — Encounter: Payer: Self-pay | Admitting: Family Medicine

## 2015-08-01 ENCOUNTER — Encounter (INDEPENDENT_AMBULATORY_CARE_PROVIDER_SITE_OTHER): Payer: Self-pay

## 2015-08-01 ENCOUNTER — Ambulatory Visit (INDEPENDENT_AMBULATORY_CARE_PROVIDER_SITE_OTHER): Payer: BLUE CROSS/BLUE SHIELD | Admitting: Family Medicine

## 2015-08-01 VITALS — BP 148/80 | HR 95 | Temp 98.7°F | Ht 67.0 in | Wt 285.5 lb

## 2015-08-01 DIAGNOSIS — I10 Essential (primary) hypertension: Secondary | ICD-10-CM | POA: Diagnosis not present

## 2015-08-01 DIAGNOSIS — E785 Hyperlipidemia, unspecified: Secondary | ICD-10-CM

## 2015-08-01 DIAGNOSIS — R739 Hyperglycemia, unspecified: Secondary | ICD-10-CM | POA: Diagnosis not present

## 2015-08-01 MED ORDER — POTASSIUM CHLORIDE ER 10 MEQ PO TBCR: 10.0000 meq | EXTENDED_RELEASE_TABLET | Freq: Every day | ORAL | Status: DC
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
## 2015-08-01 NOTE — Progress Notes (Signed)
Pre visit review using our clinic review tool, if applicable. No additional management support is needed unless otherwise documented below in the visit note. 

## 2015-08-01 NOTE — Patient Instructions (Addendum)
Get back to the DASH diet  Start checking BP 5 times a week when you are as relaxed as possible  Take your medicines as directed  Add a potassium pill once daily  Make a lab appointment for 2 weeks for potassium Follow up in 3 months- bring your cuff with you

## 2015-08-03 NOTE — Assessment & Plan Note (Addendum)
Pt forgot med on vacation and just started back  bp not optimal yet BP: (!) 150/90 mmHg   148/80 on re check however  Get back to the DASH diet  Start checking BP 5 times a week when you are as relaxed as possible  Take your medicines as directed  Add a potassium pill once daily  Make a lab appointment for 2 weeks for potassium Follow up in 3 months- bring your cuff with you

## 2015-08-03 NOTE — Assessment & Plan Note (Signed)
Discussed how this problem influences overall health and the risks it imposes  Reviewed plan for weight loss with lower calorie diet (via better food choices and also portion control or program like weight watchers) and exercise building up to or more than 30 minutes 5 days per week including some aerobic activity   Pt is motivated to get back on track

## 2015-08-03 NOTE — Assessment & Plan Note (Signed)
Diet controlled Disc goals for lipids and reasons to control them Rev labs with pt Rev low sat fat diet in detail  LDL 112

## 2015-08-03 NOTE — Assessment & Plan Note (Signed)
Lab Results  Component Value Date   HGBA1C 6.2 07/14/2015   Now doing well with metformin XR Enc low glycemic diet and wt loss

## 2015-08-15 ENCOUNTER — Other Ambulatory Visit: Payer: BLUE CROSS/BLUE SHIELD

## 2015-10-29 ENCOUNTER — Ambulatory Visit: Payer: BLUE CROSS/BLUE SHIELD | Admitting: Family Medicine

## 2015-11-07 ENCOUNTER — Ambulatory Visit: Payer: BLUE CROSS/BLUE SHIELD | Admitting: Family Medicine

## 2015-11-07 DIAGNOSIS — Z0289 Encounter for other administrative examinations: Secondary | ICD-10-CM

## 2015-11-10 ENCOUNTER — Telehealth: Payer: Self-pay | Admitting: Family Medicine

## 2015-11-10 NOTE — Telephone Encounter (Signed)
Patient did not come for their scheduled appointment 6/2 3 month follow up Please let me know if the patient needs to be contacted immediately for follow up or if no follow up is necessary.

## 2015-11-12 ENCOUNTER — Encounter: Payer: Self-pay | Admitting: Family Medicine

## 2015-11-13 ENCOUNTER — Telehealth: Payer: Self-pay | Admitting: Family Medicine

## 2015-11-13 NOTE — Telephone Encounter (Signed)
Patient dismissed from [facility] by Dr. Roxy MannsMarne Tower, effective 11/12/2015. Dismissal letter sent out by certified / registered mail. fbg  Certified dismissal letter returned as undeliverable, unclaimed, return to sender after three attempts by [employer]PS. Letter placed in another envelope and resent as 1st class mail which does not require a signature. Copy of returned certified mail scanned under media tab 12/08/15 DAJ

## 2016-02-11 ENCOUNTER — Encounter: Payer: Self-pay | Admitting: Family Medicine

## 2016-02-11 ENCOUNTER — Ambulatory Visit (INDEPENDENT_AMBULATORY_CARE_PROVIDER_SITE_OTHER): Payer: BLUE CROSS/BLUE SHIELD | Admitting: Family Medicine

## 2016-02-11 VITALS — BP 151/100 | Ht 67.0 in | Wt 287.0 lb

## 2016-02-11 DIAGNOSIS — Z124 Encounter for screening for malignant neoplasm of cervix: Secondary | ICD-10-CM

## 2016-02-11 DIAGNOSIS — Z01419 Encounter for gynecological examination (general) (routine) without abnormal findings: Secondary | ICD-10-CM | POA: Diagnosis not present

## 2016-02-11 DIAGNOSIS — N912 Amenorrhea, unspecified: Secondary | ICD-10-CM | POA: Diagnosis not present

## 2016-02-11 DIAGNOSIS — I10 Essential (primary) hypertension: Secondary | ICD-10-CM | POA: Diagnosis not present
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
## 2016-02-12 ENCOUNTER — Encounter: Payer: Self-pay | Admitting: *Deleted

## 2016-02-12 LAB — FOLLICLE STIMULATING HORMONE: FSH: 36.5 m[IU]/mL

## 2016-02-12 LAB — CYTOLOGY - PAP

## 2016-02-12 LAB — LUTEINIZING HORMONE: LH: 12.2 m[IU]/mL

## 2016-02-23 ENCOUNTER — Telehealth: Payer: Self-pay | Admitting: *Deleted

## 2016-02-23 DIAGNOSIS — Z780 Asymptomatic menopausal state: Secondary | ICD-10-CM | POA: Insufficient documentation

## 2016-02-23 NOTE — Telephone Encounter (Signed)
Pt called requesting results from lab work, sent message to the provider to review and inform me of next step or recommendations.

## 2016-02-23 NOTE — Telephone Encounter (Signed)
Labs consistent with having gone through menopause per clinical presentation. Patient meets criteria for premature ovarian failure as she was likely about [age] yo when she stopped ovulating.  Patient will be informed that testing is consistent with menopause and that any vaginal bleeding is concerning. Additionally her pap was negative with HR HPV negative. Next in 5 yrs  Federico FlakeKimberly Niles Kacee Sukhu, MD, MPH, ABFM Attending Physician Faculty Practice[facility]

## 2016-02-24 ENCOUNTER — Telehealth: Payer: Self-pay | Admitting: *Deleted

## 2016-02-24 NOTE — Telephone Encounter (Signed)
Informed pt of LH and FSH result and that Dr Alvester MorinNewton interpreted results and states based on results she is postmenopausal so if she starts to experience any kind of vaginal bleeding to inform us and schedule evaluation.

## 2016-07-17 ENCOUNTER — Other Ambulatory Visit: Payer: Self-pay | Admitting: Family Medicine

## 2016-07-30 ENCOUNTER — Institutional Professional Consult (permissible substitution): Payer: BLUE CROSS/BLUE SHIELD | Admitting: Pulmonary Disease

## 2016-09-08 ENCOUNTER — Institutional Professional Consult (permissible substitution): Payer: BLUE CROSS/BLUE SHIELD | Admitting: Pulmonary Disease

## 2016-09-11 DIAGNOSIS — F419 Anxiety disorder, unspecified: Secondary | ICD-10-CM | POA: Diagnosis not present

## 2017-03-11 ENCOUNTER — Ambulatory Visit: Payer: BLUE CROSS/BLUE SHIELD | Admitting: Obstetrics & Gynecology

## 2017-08-23 ENCOUNTER — Ambulatory Visit: Payer: BLUE CROSS/BLUE SHIELD | Admitting: Family Medicine

## 2018-01-14 ENCOUNTER — Encounter (HOSPITAL_COMMUNITY): Payer: Self-pay | Admitting: Emergency Medicine

## 2018-01-14 ENCOUNTER — Other Ambulatory Visit: Payer: Self-pay

## 2018-01-14 ENCOUNTER — Ambulatory Visit (HOSPITAL_COMMUNITY)
Admission: EM | Admit: 2018-01-14 | Discharge: 2018-01-14 | Disposition: A | Discharge status: Home / Self Care | Payer: BLUE CROSS/BLUE SHIELD | Attending: Internal Medicine | Admitting: Internal Medicine

## 2018-01-14 DIAGNOSIS — Z760 Encounter for issue of repeat prescription: Secondary | ICD-10-CM | POA: Diagnosis not present

## 2018-01-14 DIAGNOSIS — I10 Essential (primary) hypertension: Secondary | ICD-10-CM | POA: Diagnosis not present

## 2018-01-14 LAB — POCT I-STAT, CHEM 8
BUN: 19 mg/dL (ref 6–20)
CALCIUM ION: 1.24 mmol/L (ref 1.15–1.40)
CREATININE: 1.3 mg/dL — AB (ref 0.44–1.00)
Chloride: 106 mmol/L (ref 98–111)
GLUCOSE: 103 mg/dL — AB (ref 70–99)
HCT: 46 % (ref 36.0–46.0)
Hemoglobin: 15.6 g/dL — ABNORMAL HIGH (ref 12.0–15.0)
Potassium: 4.2 mmol/L (ref 3.5–5.1)
SODIUM: 143 mmol/L (ref 135–145)
TCO2: 25 mmol/L (ref 22–32)

## 2018-01-14 MED ORDER — NIFEDIPINE ER OSMOTIC RELEASE 90 MG PO TB24: 90.0000 mg | ORAL_TABLET | Freq: Every day | ORAL | 0 refills | Status: AC

## 2018-01-14 MED ORDER — SPIRONOLACTONE 25 MG PO TABS: 25.0000 mg | ORAL_TABLET | Freq: Every day | ORAL | 0 refills | Status: AC

## 2018-01-14 MED ORDER — METFORMIN HCL ER 750 MG PO TB24: ORAL_TABLET | ORAL | 0 refills | Status: AC

## 2018-01-14 NOTE — ED Triage Notes (Signed)
Ran out of blood pressure medicine was 2-3 days ago.  Patient notices she is retaining fluid, slight sob going up steps and in general not feeling as good as usual

## 2018-01-14 NOTE — Discharge Instructions (Addendum)
Your potassium looks well today, as does your current blood sugar.  Your kidney function is slightly elevated which can be related to uncontrolled blood pressure as well as some dehydration and other causes.  It will be very important to have your labs and bp rechecked within the month to see if your medications need adjustment.  If develop chest pain , dizziness, palpitations, headache please go to Er.
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
## 2018-03-27 ENCOUNTER — Other Ambulatory Visit: Payer: Self-pay | Admitting: Internal Medicine

## 2018-03-27 DIAGNOSIS — Z1231 Encounter for screening mammogram for malignant neoplasm of breast: Secondary | ICD-10-CM

## 2018-05-29 ENCOUNTER — Ambulatory Visit
Admission: RE | Admit: 2018-05-29 | Discharge: 2018-05-29 | Disposition: A | Discharge status: Home / Self Care | Payer: BLUE CROSS/BLUE SHIELD | Source: Ambulatory Visit | Attending: Internal Medicine | Admitting: Internal Medicine

## 2018-05-29 DIAGNOSIS — Z1231 Encounter for screening mammogram for malignant neoplasm of breast: Secondary | ICD-10-CM

## 2018-06-01 ENCOUNTER — Encounter: Payer: Self-pay | Admitting: Obstetrics & Gynecology

## 2018-06-01 ENCOUNTER — Ambulatory Visit (INDEPENDENT_AMBULATORY_CARE_PROVIDER_SITE_OTHER): Payer: BLUE CROSS/BLUE SHIELD | Admitting: Obstetrics & Gynecology

## 2018-06-01 VITALS — BP 149/90 | HR 102 | Ht 67.0 in | Wt 300.0 lb

## 2018-06-01 DIAGNOSIS — Z01419 Encounter for gynecological examination (general) (routine) without abnormal findings: Secondary | ICD-10-CM

## 2018-06-01 DIAGNOSIS — Z1151 Encounter for screening for human papillomavirus (HPV): Secondary | ICD-10-CM | POA: Diagnosis not present

## 2018-06-01 DIAGNOSIS — Z124 Encounter for screening for malignant neoplasm of cervix: Secondary | ICD-10-CM

## 2018-06-01 NOTE — Patient Instructions (Signed)
Thank you for enrolling in [facility]. Please follow the instructions below to securely access your online medical record. MyChart allows you to send messages to your doctor, view your test results, manage appointments, and more.   How Do I Sign Up? 1. In your Internet browser, go to [employer] and enter [email or web address]. 2. Click on the Sign Up Now link in the Sign In box. You will see the New Member Sign Up page. 3. Enter your MyChart Access Code exactly as it appears below. You will not need to use this code after you've completed the sign-up process. If you do not sign up before the expiration date, you must request a new code.  MyChart Access Code: [email or web address] Expires: 07/16/2018  1:33 PM  4. Enter your Social Security Number ([email or web address]) and Date of Birth (mm/dd/yyyy) as indicated and click Submit. You will be taken to the next sign-up page. 5. Create a MyChart ID. This will be your MyChart login ID and cannot be changed, so think of one that is secure and easy to remember. 6. Create a MyChart password. You can change your password at any time. 7. Enter your Password Reset Question and Answer. This can be used at a later time if you forget your password.  8. Enter your e-mail address. You will receive e-mail notification when new information is available in [place]. 9. Click Sign Up. You can now view your medical record.   Additional Information Remember, MyChart is NOT to be used for urgent needs. For medical emergencies, dial 911.   Preventive Care 40-64 Years, Female Preventive care refers to lifestyle choices and visits with your health care provider that can promote health and wellness. What does preventive care include?   A yearly physical exam. This is also called an annual well check.  Dental exams once or twice a year.  Routine eye exams. Ask your health care provider how often you should have your eyes checked.  Personal lifestyle choices,  including: ? Daily care of your teeth and gums. ? Regular physical activity. ? Eating a healthy diet. ? Avoiding tobacco and drug use. ? Limiting alcohol use. ? Practicing safe sex. ? Taking low-dose aspirin daily starting at age 104. ? Taking vitamin and mineral supplements as recommended by your health care provider. What happens during an annual well check? The services and screenings done by your health care provider during your annual well check will depend on your age, overall health, lifestyle risk factors, and family history of disease. Counseling Your health care provider may ask you questions about your:  Alcohol use.  Tobacco use.  Drug use.  Emotional well-being.  Home and relationship well-being.  Sexual activity.  Eating habits.  Work and work Statistician.  Method of birth control.  Menstrual cycle.  Pregnancy history. Screening You may have the following tests or measurements:  Height, weight, and BMI.  Blood pressure.  Lipid and cholesterol levels. These may be checked every 5 years, or more frequently if you are over 51 years old.  Skin check.  Lung cancer screening. You may have this screening every year starting at age 72 if you have a 30-pack-year history of smoking and currently smoke or have quit within the past 15 years.  Colorectal cancer screening. All adults should have this screening starting at age 40 and continuing until age 79. Your health care provider may recommend screening at age 67. You will have tests every 1-10 years, depending on your results and the type of  screening test. People at increased risk should start screening at an earlier age. Screening tests may include: ? Guaiac-based fecal occult blood testing. ? Fecal immunochemical test (FIT). ? Stool DNA test. ? Virtual colonoscopy. ? Sigmoidoscopy. During this test, a flexible tube with a tiny camera (sigmoidoscope) is used to examine your rectum and lower colon. The  sigmoidoscope is inserted through your anus into your rectum and lower colon. ? Colonoscopy. During this test, a long, thin, flexible tube with a tiny camera (colonoscope) is used to examine your entire colon and rectum.  Hepatitis C blood test.  Hepatitis B blood test.  Sexually transmitted disease (STD) testing.  Diabetes screening. This is done by checking your blood sugar (glucose) after you have not eaten for a while (fasting). You may have this done every 1-3 years.  Mammogram. This may be done every 1-2 years. Talk to your health care provider about when you should start having regular mammograms. This may depend on whether you have a family history of breast cancer.  BRCA-related cancer screening. This may be done if you have a family history of breast, ovarian, tubal, or peritoneal cancers.  Pelvic exam and Pap test. This may be done every 3 years starting at age 67. Starting at age 8, this may be done every 5 years if you have a Pap test in combination with an HPV test.  Bone density scan. This is done to screen for osteoporosis. You may have this scan if you are at high risk for osteoporosis. Discuss your test results, treatment options, and if necessary, the need for more tests with your health care provider. Vaccines Your health care provider may recommend certain vaccines, such as:  Influenza vaccine. This is recommended every year.  Tetanus, diphtheria, and acellular pertussis (Tdap, Td) vaccine. You may need a Td booster every 10 years.  Varicella vaccine. You may need this if you have not been vaccinated.  Zoster vaccine. You may need this after age 28.  Measles, mumps, and rubella (MMR) vaccine. You may need at least one dose of MMR if you were born in 1957 or later. You may also need a second dose.  Pneumococcal 13-valent conjugate (PCV13) vaccine. You may need this if you have certain conditions and were not previously vaccinated.  Pneumococcal polysaccharide  (PPSV23) vaccine. You may need one or two doses if you smoke cigarettes or if you have certain conditions.  Meningococcal vaccine. You may need this if you have certain conditions.  Hepatitis A vaccine. You may need this if you have certain conditions or if you travel or work in places where you may be exposed to hepatitis A.  Hepatitis B vaccine. You may need this if you have certain conditions or if you travel or work in places where you may be exposed to hepatitis B.  Haemophilus influenzae type b (Hib) vaccine. You may need this if you have certain conditions. Talk to your health care provider about which screenings and vaccines you need and how often you need them. This information is not intended to replace advice given to you by your health care provider. Make sure you discuss any questions you have with your health care provider. Document Released: 06/20/2015 Document Revised: 07/14/2017 Document Reviewed: 03/25/2015 Elsevier Interactive Patient Education  2019 [employer].
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
## 2018-06-05 LAB — CYTOLOGY - PAP
Diagnosis: NEGATIVE
HPV: NOT DETECTED

## 2018-08-17 DIAGNOSIS — R7309 Other abnormal glucose: Secondary | ICD-10-CM | POA: Diagnosis not present

## 2018-08-17 DIAGNOSIS — I10 Essential (primary) hypertension: Secondary | ICD-10-CM | POA: Diagnosis not present

## 2018-08-17 DIAGNOSIS — E559 Vitamin D deficiency, unspecified: Secondary | ICD-10-CM | POA: Diagnosis not present

## 2018-08-17 DIAGNOSIS — E785 Hyperlipidemia, unspecified: Secondary | ICD-10-CM | POA: Diagnosis not present

## 2018-08-19 DIAGNOSIS — E559 Vitamin D deficiency, unspecified: Secondary | ICD-10-CM | POA: Diagnosis not present

## 2018-08-19 DIAGNOSIS — R7309 Other abnormal glucose: Secondary | ICD-10-CM | POA: Diagnosis not present

## 2018-08-19 DIAGNOSIS — E785 Hyperlipidemia, unspecified: Secondary | ICD-10-CM | POA: Diagnosis not present

## 2018-08-19 DIAGNOSIS — I10 Essential (primary) hypertension: Secondary | ICD-10-CM | POA: Diagnosis not present

## 2019-03-27 ENCOUNTER — Encounter: Payer: Self-pay | Admitting: Radiology

## 2019-04-19 ENCOUNTER — Other Ambulatory Visit: Payer: Self-pay | Admitting: Internal Medicine

## 2019-04-19 DIAGNOSIS — Z1231 Encounter for screening mammogram for malignant neoplasm of breast: Secondary | ICD-10-CM

## 2019-06-05 ENCOUNTER — Other Ambulatory Visit: Payer: Self-pay

## 2019-06-05 ENCOUNTER — Ambulatory Visit (INDEPENDENT_AMBULATORY_CARE_PROVIDER_SITE_OTHER): Payer: BC Managed Care – PPO | Admitting: Obstetrics & Gynecology

## 2019-06-05 ENCOUNTER — Encounter: Payer: Self-pay | Admitting: Obstetrics & Gynecology

## 2019-06-05 VITALS — BP 157/104 | HR 85 | Ht 67.0 in | Wt 285.0 lb

## 2019-06-05 DIAGNOSIS — Z01419 Encounter for gynecological examination (general) (routine) without abnormal findings: Secondary | ICD-10-CM | POA: Diagnosis not present

## 2019-06-05 NOTE — Progress Notes (Signed)
Subjective:    Hannah Webb is a 53 y.o. married P1 (87 yo son) who presents for an annual exam. The patient has no complaints today. The patient is sexually active. GYN screening history: last pap: was normal. The patient wears seatbelts: yes. The patient participates in regular exercise: no. Has the patient ever been transfused or tattooed?: no. The patient reports that there is not domestic violence in her life.   Menstrual History: OB History    Gravida  1   Para  1   Term  0   Preterm  1   AB  0   Living  1     SAB  0   TAB  0   Ectopic  0   Multiple  0   Live Births  1           Menarche age: 34 No LMP recorded. Patient is postmenopausal.    The following portions of the patient's history were reviewed and updated as appropriate: allergies, current medications, past family history, past medical history, past social history, past surgical history and problem list.  Review of Systems Pertinent items are noted in HPI.   Married for 26 years Works at [employer], planning to work in [place] period several years ago [place] scheduled next month Declines flu vaccine today Planning colon screening 2/20   Objective:    BP (!) 157/104   Pulse 85   Ht 5\' 7"  (1.702 m)   Wt 285 lb (129.3 kg)   BMI 44.64 kg/m   General Appearance:    Alert, cooperative, no distress, appears stated age  Head:    Normocephalic, without obvious abnormality, atraumatic  Eyes:    PERRL, conjunctiva/corneas clear, EOM's intact, fundi    benign, both eyes  Ears:    Normal TM's and external ear canals, both ears  Nose:   Nares normal, septum midline, mucosa normal, no drainage    or sinus tenderness  Throat:   Lips, mucosa, and tongue normal; teeth and gums normal  Neck:   Supple, symmetrical, trachea midline, no adenopathy;    thyroid:  no enlargement/tenderness/nodules; no carotid   bruit or JVD  Back:     Symmetric, no curvature, ROM normal, no CVA tenderness  Lungs:      Clear to auscultation bilaterally, respirations unlabored  Chest Wall:    No tenderness or deformity   Heart:    Regular rate and rhythm, S1 and S2 normal, no murmur, rub   or gallop  Breast Exam:    No tenderness, masses, or nipple abnormality  Abdomen:     Soft, non-tender, bowel sounds active all four quadrants,    no masses, no organomegaly  Genitalia:    Normal female without lesion, discharge or tenderness, normal size and shape, anteverted, mobile, non-tender, normal adnexal exam      Extremities:   Extremities normal, atraumatic, no cyanosis or edema  Pulses:   2+ and symmetric all extremities  Skin:   Skin color, texture, turgor normal, no rashes or lesions  Lymph nodes:   Cervical, supraclavicular, and axillary nodes normal  Neurologic:   CNII-XII intact, normal strength, sensation and reflexes    throughout  .    Assessment:    Healthy female exam.    Plan:  She declines a pap smear today, we discussed ACOG recs (q3 years)

## 2019-06-15 ENCOUNTER — Ambulatory Visit: Payer: BLUE CROSS/BLUE SHIELD

## 2019-07-26 ENCOUNTER — Ambulatory Visit: Payer: BC Managed Care – PPO

## 2019-08-31 ENCOUNTER — Ambulatory Visit: Payer: BC Managed Care – PPO

## 2020-02-13 IMAGING — MG DIGITAL SCREENING BILATERAL MAMMOGRAM WITH TOMO AND CAD
6 of 10 series · 6 of 30 positions shown · non-contrast
Comparison: Previous exam(s).

ACR Breast Density Category a: The breast tissue is almost entirely
fatty.

CLINICAL DATA: Screening.

EXAM:
DIGITAL SCREENING BILATERAL MAMMOGRAM WITH TOMO AND CAD

[R MLO synth-2D (1 of 2)]
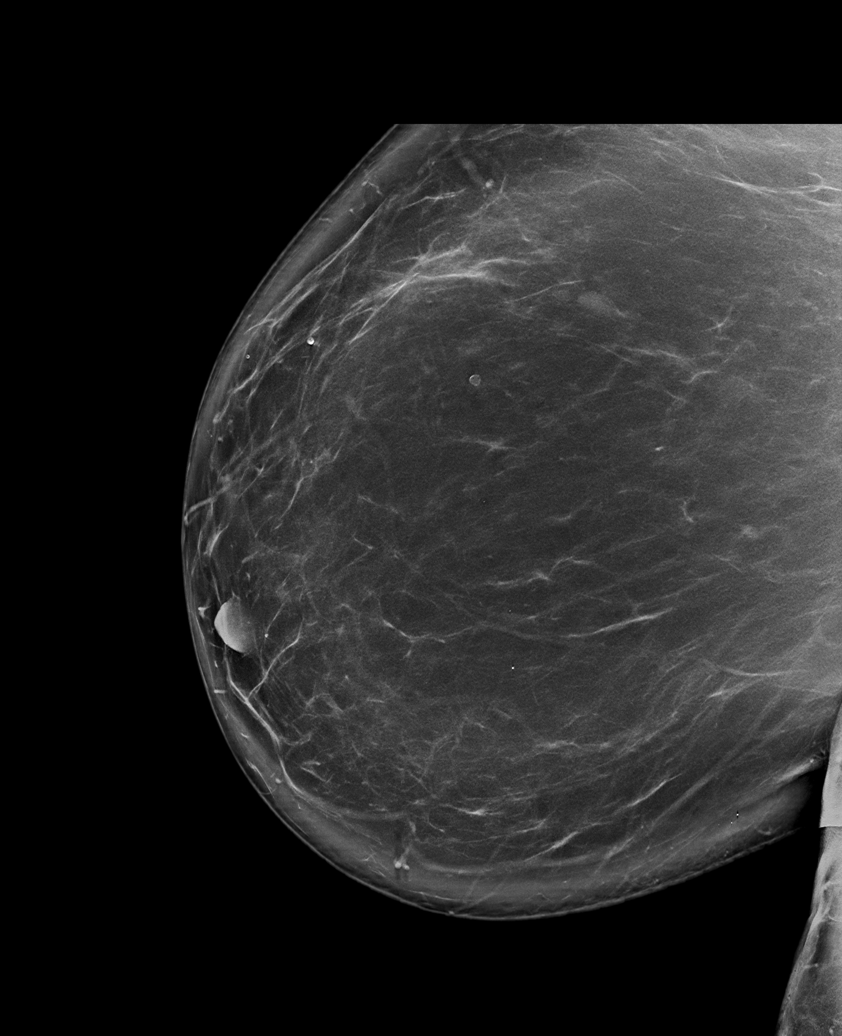

[R MLO synth-2D (2 of 2)]
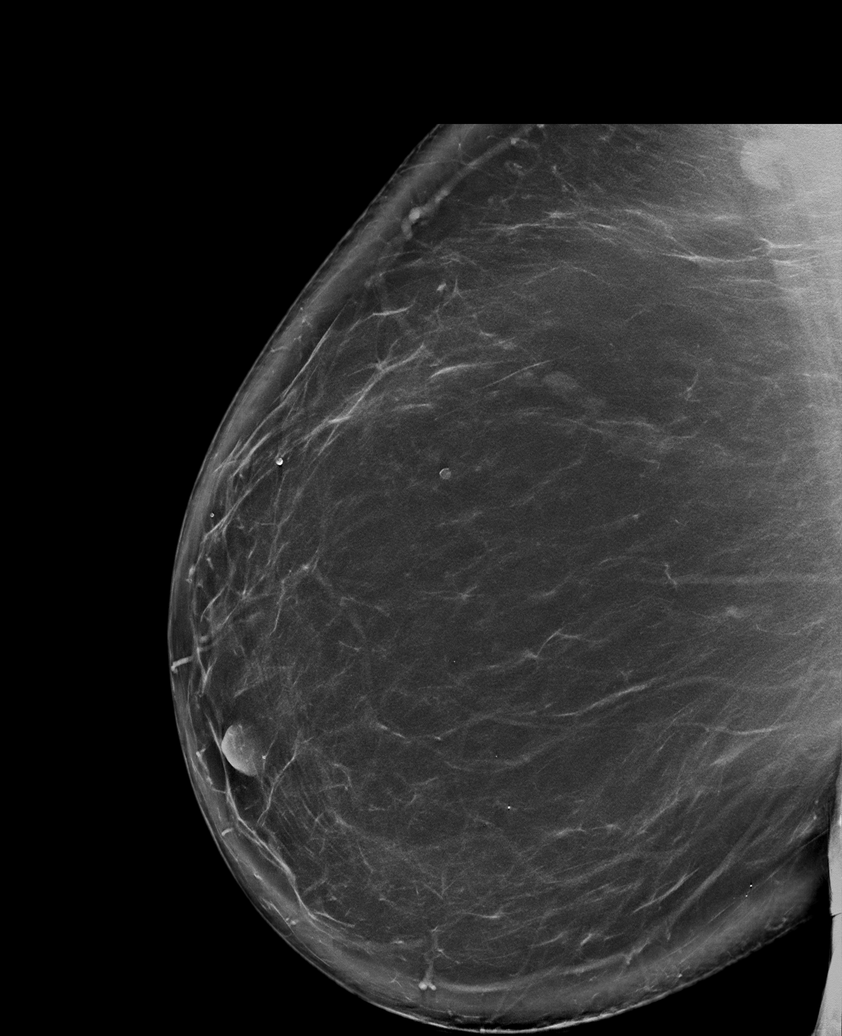

[R CC synth-2D]
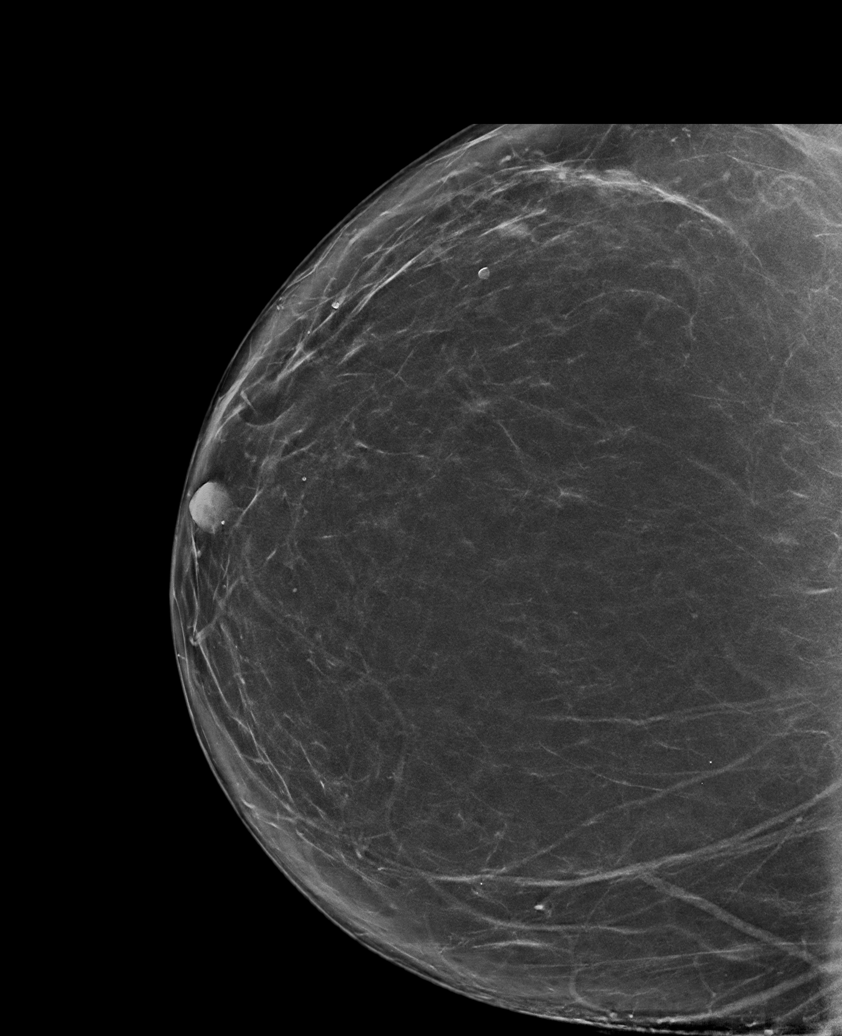

[L CC synth-2D]
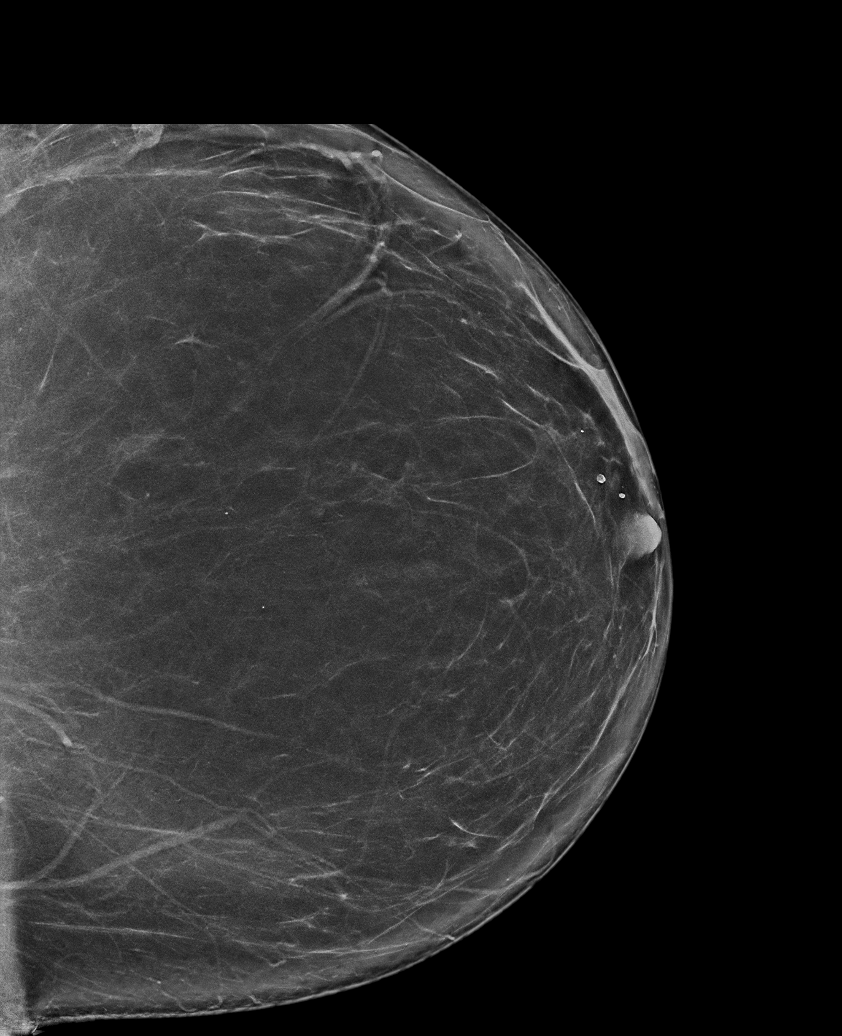

[L MLO synth-2D]
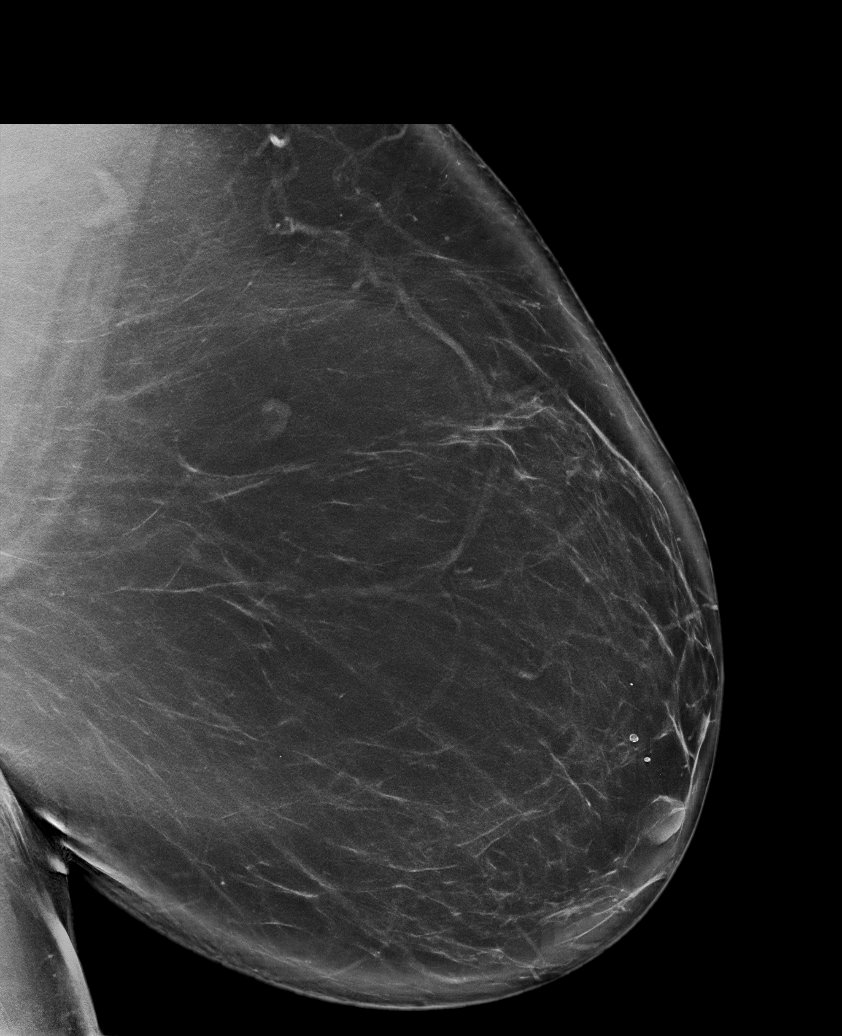

[L CC tomo · tomo slice 50/99.0]
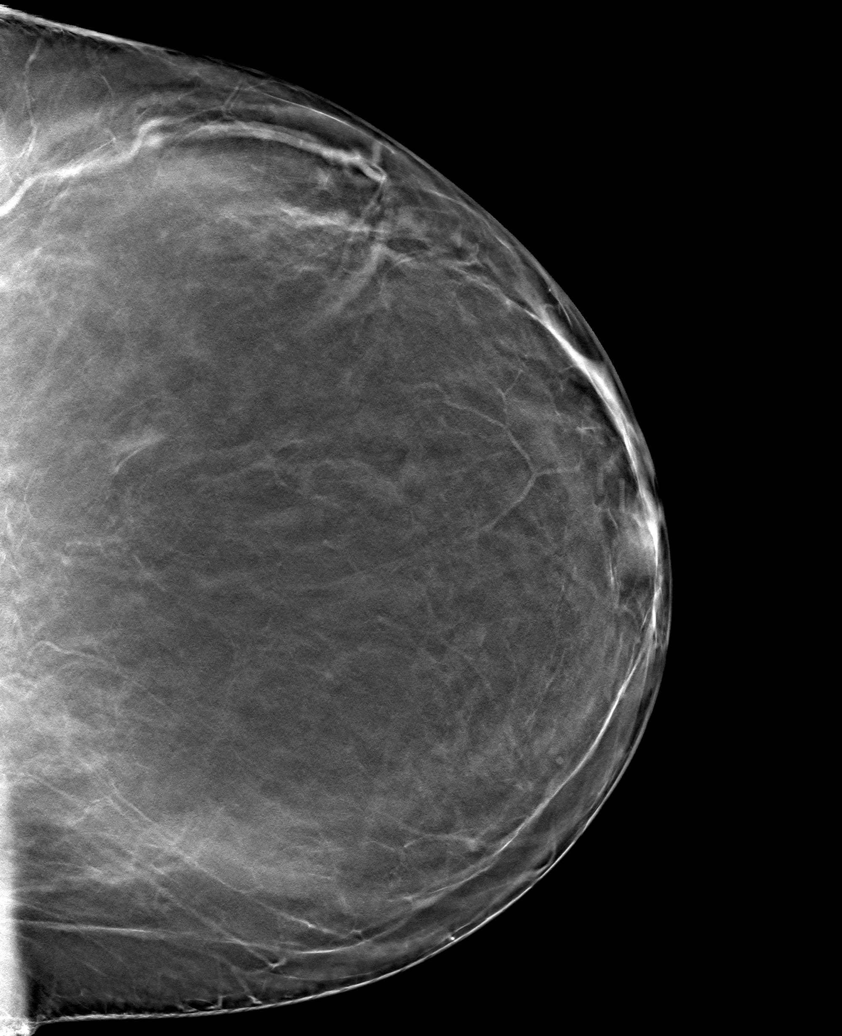

[6 of 30 positions shown; findings below may reference images not displayed]

FINDINGS: There are no findings suspicious for malignancy. Images were
processed with CAD.
IMPRESSION: No mammographic evidence of malignancy. A result letter of this
screening mammogram will be mailed directly to the patient.

RECOMMENDATION:
Screening mammogram in one year. (Code:8Y-[id number])

BI-RADS CATEGORY  1: Negative.

## 2020-04-04 ENCOUNTER — Encounter: Payer: Self-pay | Admitting: Radiology

## 2022-10-27 ENCOUNTER — Other Ambulatory Visit: Payer: Self-pay | Admitting: Internal Medicine

## 2022-10-27 DIAGNOSIS — R109 Unspecified abdominal pain: Secondary | ICD-10-CM

## 2022-11-08 ENCOUNTER — Ambulatory Visit
Admission: RE | Admit: 2022-11-08 | Discharge: 2022-11-08 | Disposition: A | Discharge status: Home / Self Care | Payer: BC Managed Care – PPO | Source: Ambulatory Visit | Attending: Internal Medicine | Admitting: Internal Medicine

## 2022-11-08 DIAGNOSIS — R109 Unspecified abdominal pain: Secondary | ICD-10-CM

## 2022-11-11 ENCOUNTER — Other Ambulatory Visit: Payer: BC Managed Care – PPO

## 2022-12-13 ENCOUNTER — Other Ambulatory Visit: Payer: Self-pay | Admitting: Internal Medicine

## 2022-12-13 DIAGNOSIS — R109 Unspecified abdominal pain: Secondary | ICD-10-CM

## 2022-12-16 ENCOUNTER — Ambulatory Visit
Admission: RE | Admit: 2022-12-16 | Discharge: 2022-12-16 | Disposition: A | Discharge status: Home / Self Care | Payer: BC Managed Care – PPO | Source: Ambulatory Visit | Attending: Internal Medicine | Admitting: Internal Medicine

## 2022-12-16 DIAGNOSIS — R109 Unspecified abdominal pain: Secondary | ICD-10-CM

## 2022-12-16 MED ORDER — IOPAMIDOL (ISOVUE-300) INJECTION 61%
100.0000 mL | Freq: Once | INTRAVENOUS | Status: AC | PRN
  Administered 2022-12-16: 100 mL via INTRAVENOUS
